# Patient Record
Sex: Female | Born: 1965
Health system: Southern US, Community
[De-identification: ages and names within clinical notes are randomized; demographics above are authoritative.]

## PROBLEM LIST (undated history)

## (undated) DIAGNOSIS — Z1371 Encounter for nonprocreative screening for genetic disease carrier status: Secondary | ICD-10-CM

## (undated) DIAGNOSIS — T7840XA Allergy, unspecified, initial encounter: Secondary | ICD-10-CM

## (undated) DIAGNOSIS — D249 Benign neoplasm of unspecified breast: Secondary | ICD-10-CM

## (undated) HISTORY — DX: Benign neoplasm of unspecified breast: D24.9

## (undated) HISTORY — DX: Allergy, unspecified, initial encounter: T78.40XA

## (undated) HISTORY — DX: Encounter for nonprocreative screening for genetic disease carrier status: Z13.71

---

## 1998-03-02 ENCOUNTER — Other Ambulatory Visit: Admission: RE | Admit: 1998-03-02 | Discharge: 1998-03-02 | Payer: Self-pay | Admitting: Gynecology

## 1999-03-03 ENCOUNTER — Other Ambulatory Visit: Admission: RE | Admit: 1999-03-03 | Discharge: 1999-03-03 | Payer: Self-pay | Admitting: Gynecology

## 2000-06-22 ENCOUNTER — Other Ambulatory Visit: Admission: RE | Admit: 2000-06-22 | Discharge: 2000-06-22 | Payer: Self-pay | Admitting: *Deleted

## 2000-07-04 HISTORY — PX: LIPOMA EXCISION: SHX5283

## 2001-01-14 ENCOUNTER — Inpatient Hospital Stay (HOSPITAL_COMMUNITY): Admission: AD | Admit: 2001-01-14 | Discharge: 2001-01-16 | Payer: Self-pay | Admitting: Gynecology

## 2001-01-18 ENCOUNTER — Encounter: Admission: RE | Admit: 2001-01-18 | Discharge: 2001-02-17 | Payer: Self-pay | Admitting: Gynecology

## 2001-02-26 ENCOUNTER — Other Ambulatory Visit: Admission: RE | Admit: 2001-02-26 | Discharge: 2001-02-26 | Payer: Self-pay | Admitting: Gynecology

## 2001-05-04 ENCOUNTER — Ambulatory Visit (HOSPITAL_COMMUNITY): Admission: RE | Admit: 2001-05-04 | Discharge: 2001-05-04 | Payer: Self-pay | Admitting: *Deleted

## 2001-05-04 ENCOUNTER — Encounter (INDEPENDENT_AMBULATORY_CARE_PROVIDER_SITE_OTHER): Payer: Self-pay | Admitting: Specialist

## 2002-09-04 ENCOUNTER — Other Ambulatory Visit: Admission: RE | Admit: 2002-09-04 | Discharge: 2002-09-04 | Payer: Self-pay | Admitting: Gynecology

## 2003-11-14 ENCOUNTER — Other Ambulatory Visit: Admission: RE | Admit: 2003-11-14 | Discharge: 2003-11-14 | Payer: Self-pay | Admitting: Gynecology

## 2004-12-07 ENCOUNTER — Encounter: Admission: RE | Admit: 2004-12-07 | Discharge: 2004-12-07 | Payer: Self-pay | Admitting: Gynecology

## 2005-01-07 ENCOUNTER — Other Ambulatory Visit: Admission: RE | Admit: 2005-01-07 | Discharge: 2005-01-07 | Payer: Self-pay | Admitting: Gynecology

## 2006-01-11 ENCOUNTER — Other Ambulatory Visit: Admission: RE | Admit: 2006-01-11 | Discharge: 2006-01-11 | Payer: Self-pay | Admitting: Gynecology

## 2006-03-07 ENCOUNTER — Ambulatory Visit: Payer: Self-pay | Admitting: Internal Medicine

## 2006-03-31 ENCOUNTER — Ambulatory Visit (HOSPITAL_BASED_OUTPATIENT_CLINIC_OR_DEPARTMENT_OTHER): Admission: RE | Admit: 2006-03-31 | Discharge: 2006-03-31 | Payer: Self-pay | Admitting: Gynecology

## 2006-03-31 HISTORY — PX: ENDOMETRIAL ABLATION: SHX621

## 2007-01-22 ENCOUNTER — Other Ambulatory Visit: Admission: RE | Admit: 2007-01-22 | Discharge: 2007-01-22 | Payer: Self-pay | Admitting: Gynecology

## 2007-03-17 ENCOUNTER — Encounter: Payer: Self-pay | Admitting: Internal Medicine

## 2007-09-26 ENCOUNTER — Ambulatory Visit: Payer: Self-pay | Admitting: Internal Medicine

## 2007-09-26 DIAGNOSIS — R030 Elevated blood-pressure reading, without diagnosis of hypertension: Secondary | ICD-10-CM | POA: Insufficient documentation

## 2007-09-26 DIAGNOSIS — J069 Acute upper respiratory infection, unspecified: Secondary | ICD-10-CM | POA: Insufficient documentation

## 2007-09-26 DIAGNOSIS — J309 Allergic rhinitis, unspecified: Secondary | ICD-10-CM

## 2007-10-08 ENCOUNTER — Encounter: Payer: Self-pay | Admitting: Internal Medicine

## 2007-12-28 ENCOUNTER — Encounter: Payer: Self-pay | Admitting: Internal Medicine

## 2008-01-29 ENCOUNTER — Other Ambulatory Visit: Admission: RE | Admit: 2008-01-29 | Discharge: 2008-01-29 | Payer: Self-pay | Admitting: Surgical Oncology

## 2008-03-11 ENCOUNTER — Encounter: Payer: Self-pay | Admitting: Internal Medicine

## 2008-03-12 ENCOUNTER — Ambulatory Visit: Payer: Self-pay | Admitting: Hematology

## 2008-04-03 ENCOUNTER — Ambulatory Visit: Payer: Self-pay | Admitting: Gynecology

## 2008-05-20 ENCOUNTER — Encounter: Payer: Self-pay | Admitting: Internal Medicine

## 2009-02-03 ENCOUNTER — Ambulatory Visit: Payer: Self-pay | Admitting: Gynecology

## 2009-02-03 ENCOUNTER — Other Ambulatory Visit: Admission: RE | Admit: 2009-02-03 | Discharge: 2009-02-03 | Payer: Self-pay | Admitting: Gynecology

## 2009-02-03 ENCOUNTER — Encounter: Payer: Self-pay | Admitting: Gynecology

## 2009-08-03 ENCOUNTER — Other Ambulatory Visit: Admission: RE | Admit: 2009-08-03 | Discharge: 2009-08-03 | Payer: Self-pay | Admitting: Radiology

## 2009-08-03 ENCOUNTER — Encounter: Payer: Self-pay | Admitting: Internal Medicine

## 2009-09-01 HISTORY — PX: BREAST SURGERY: SHX581

## 2009-09-02 ENCOUNTER — Ambulatory Visit (HOSPITAL_BASED_OUTPATIENT_CLINIC_OR_DEPARTMENT_OTHER): Admission: RE | Admit: 2009-09-02 | Discharge: 2009-09-02 | Payer: Self-pay | Admitting: General Surgery

## 2009-09-16 ENCOUNTER — Emergency Department (HOSPITAL_COMMUNITY): Admission: EM | Admit: 2009-09-16 | Discharge: 2009-09-17 | Payer: Self-pay | Admitting: Emergency Medicine

## 2010-01-18 ENCOUNTER — Ambulatory Visit: Payer: Self-pay | Admitting: Gynecology

## 2010-02-09 ENCOUNTER — Ambulatory Visit: Payer: Self-pay | Admitting: Gynecology

## 2010-02-09 ENCOUNTER — Other Ambulatory Visit: Admission: RE | Admit: 2010-02-09 | Discharge: 2010-02-09 | Payer: Self-pay | Admitting: Gynecology

## 2010-08-30 ENCOUNTER — Encounter: Payer: Self-pay | Admitting: Internal Medicine

## 2010-09-03 ENCOUNTER — Encounter: Payer: Self-pay | Admitting: Internal Medicine

## 2010-09-09 NOTE — Miscellaneous (Signed)
  Clinical Lists Changes  Observations: Added new observation of MAMMOGRAM: normal (08/30/2010 15:28)      Preventive Care Screening  Mammogram:    Date:  08/30/2010    Results:  normal

## 2010-09-27 LAB — DIFFERENTIAL
Basophils Relative: 1 % (ref 0–1)
Eosinophils Relative: 4 % (ref 0–5)
Lymphocytes Relative: 31 % (ref 12–46)
Monocytes Absolute: 0.7 10*3/uL (ref 0.1–1.0)
Monocytes Relative: 10 % (ref 3–12)
Neutro Abs: 3.6 10*3/uL (ref 1.7–7.7)

## 2010-09-27 LAB — CBC
Hemoglobin: 12.1 g/dL (ref 12.0–15.0)
Platelets: 291 10*3/uL (ref 150–400)

## 2010-09-27 LAB — BASIC METABOLIC PANEL
BUN: 10 mg/dL (ref 6–23)
CO2: 27 mEq/L (ref 19–32)
Calcium: 8.9 mg/dL (ref 8.4–10.5)
Chloride: 106 mEq/L (ref 96–112)
Creatinine, Ser: 0.61 mg/dL (ref 0.4–1.2)
GFR calc Af Amer: >60 mL/min (ref >60)
Glucose, Bld: 99 mg/dL (ref 70–99)

## 2010-11-19 NOTE — Op Note (Signed)
[facility]  Patient:    ZALEY, TALLEY Visit Number: [id number] MRN: [id number]          Service Type: DSU Location: DAY Attending Physician:  Vikki Ports. Dictated by:   Catalina Lunger, M.D. Proc. Date: 05/04/01 Admit Date:  05/04/2001 Discharge Date: 05/04/2001                             Operative Report  PREOPERATIVE DIAGNOSIS:  Bilateral axillary masses.  POSTOPERATIVE DIAGNOSIS:  Bilateral axillary masses.  PROCEDURE:  Excision of bilateral axillary masses.  ANESTHESIA:  General.  DESCRIPTION OF PROCEDURE:  The patient was taken to the operating room and placed in a supine position after adequate general anesthesia was induced. Bilateral axillas were prepped and draped in a normal sterile fashion.  Using an elliptical incision encompassing a significant amount of skin in the right axilla secondary to its redundance and dissected down through subcutaneous tissue, excising the fibrous mass in its entirity.  The defect was then closed with a running 3-0 nylon suture.  An identical procedure was then performed on the left axilla.  Again, this was closed with a running 3-0 nylon suture. Sterile dressings were applied.  The patient tolerated the procedure well and went to PACU in good condition. Dictated by:   Catalina Lunger, M.D. Attending Physician:  Vikki Ports DD:  05/29/01 TD:  05/29/01 Job: [id number]

## 2010-11-19 NOTE — Discharge Summary (Signed)
[facility]  Patient:    Isabel Skinner, Isabel Skinner                         MRN: [id number] Adm. Date:  [id number] Disc. Date: [id number] Attending:  Tonye Royalty Dictator:   Antony Contras, [facility]                           Discharge Summary  DISCHARGE DIAGNOSES:          1. Intrauterine pregnancy at term.                               2. Spontaneous onset of labor.  PROCEDURES:                   Normal spontaneous vaginal delivery of                               a viable infant over midline episiotomy, repair                               of periurethral tear.  HISTORY OF PRESENT ILLNESS:   The patient is a 45 year old, gravida 3, para 1, Ab 1. Prenatal course was benign with the exception that she had advanced maternal age, had a genetic amniocentesis with a normal chromosome. Also, she was being followed by Dr. 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 She will also follow her postpartum, if needed. Total Hollister was not available. The patients GBS status was negative.  HOSPITAL COURSE AND TREATMENT:                The patient was admitted on January 14, 2001 with spontaneous onset of labor. She was initially augmented with Pitocin and then had spontaneous rupture of membranes, and went on to become completely dilated and was delivered of an Apgar 8/9 female infant weighing 6 pounds 8 ounces over midline episiotomy with repair of periurethral tear which did require the placement of a Foley catheter for 24 hours. She also sustained another smaller perilabial tear which was not bleeding and was left intact.  POSTPARTUM COURSE:            She remained afebrile, had no difficulty voiding once the catheter was removed.  CBC: Hematocrit 32.7, hemoglobin 11.3, WBCs 14.4, platelets 217,000.  She was able to be discharged on her second  postpartum day in satisfactory condition.  DISPOSITION:                  The patient is to follow up in six weeks.  DISCHARGE MEDICATIONS:        The patient is to continue prenatal vitamins and iron, and Motrin and Tylox for pain. DD:  02/08/01 TD:  02/04/01 Job: [id number]

## 2010-11-19 NOTE — Assessment & Plan Note (Signed)
[facility]                             PRIMARY CARE OFFICE NOTE   HANIYYAH, SAKUMA                         MRN:          [id number]  DATE:03/07/2006                            DOB:          [birth year]    The patient is a 45 year old female who presents to establish primary with  me.  She needs to address problems with her elevated blood pressure and  allergies.   PAST MEDICAL HISTORY:  1. Dental implants.  2. Elevated cholesterol.   ALLERGIES:  No known drug allergies.   CURRENT MEDICATIONS:  1. Astelin.  2. Nasonex.  3. Allegra D p.r.n.   FAMILY HISTORY:  Father with hypertension.  Mother died of breast cancer at  the age of 42.  One sister was diagnosed with breast cancer at the age of  65.   SOCIAL HISTORY:  He is married with 2 children.  She is a Clinical biochemist.  Does not smoke.  Rare alcohol.  Has been exercising 3 times a week.   REVIEW OF SYSTEMS:  No chest pain or shortness of breath.  Blood pressure  was 140/92 when she presented for dental implants.  Regular GYN care with  Dr. Lily Peer.  She breast fed for 10 months.  Her youngest is 45 years old.  Normal periods.  The rest is negative.   PHYSICAL EXAMINATION:  VITAL SIGNS:  Blood pressure 112/77, pulse 88,  temperature 98.8, weight 142 pounds.  She is in no acute distress.  Looks  well.  HEENT:  Moist mucosa.  NECK:  Supple.  No thyromegaly or bruit.  LUNGS:  Clear.  No wheezes or rales.  HEART:  S1 and S2.  No murmur, no gallop.  ABDOMEN:  Soft, nontender.  No organomegaly is felt.  EXTREMITIES:  Lower extremities without edema.  NEUROLOGIC:  She is alert, oriented and cooperative.  Denies being  depression.   LABORATORY DATA:  None available.   ASSESSMENT AND PLAN:  1. Elevated blood pressure with normal numbers today.  She will purchase a      blood pressure cuff and record blood pressure twice a week for 3      months.  Low-salt diet.  I will see her back in 3  months to review.      Obtain lab work.  2. Family history of breast cancer.  She will look up information      regarding tamoxifen.  She was not interested in genetic testing in the      past.  She started with a mammogram and      regular breasts exams with Dr. Lily Peer.  3. Allergies.  She will try Allegra 180 mg daily through the season.                                   Georgina Quint. Plotnikov, MD   AVP/MedQ  DD:  03/09/2006  DT:  03/09/2006  Job #:  [id number]

## 2010-11-19 NOTE — H&P (Signed)
[facility]  Patient:    Isabel Skinner, DONE                           MRN: [id number] Adm. Date:  01/14/01 Attending:  Gaetano Hawthorne. Lily Peer, M.D.                         History and Physical  The patient is a 45 year old, gravida 3, para 1, AB 1, currently 39-4/[redacted] weeks gestation.  She was admitted to [facility] at approximately 0220 hours complaining of contractions.  She was found to be contracting every four to five minutes apart with a reassuring fetal heart rate tracing.  Her cervix on admission was 4 cm, 50% effaced, and vertex -1 station.  Her membranes were intact.  Her vital signs were as follows:  Blood pressure 118/80, pulse 90, respirations 18, temperature 97.1 degrees.  Her group B streptococcus status was negative.  She had a benign prenatal course with the exception that she had advanced maternal age and had a genetic amniocentesis with a normal chromosome fetus XX.  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  Will follow postpartum at her six-week visit if still present or causing her any problem.  The patient continued to labor spontaneously, but her contractions became more infrequent.  She was started on Pitocin augmentation at approximately 0530 hours in the morning.  At approximately 0826 hours this morning, she had spontaneous rupture of membranes and went on to be completely dilated at 0805 hours and had a normal spontaneous vaginal delivery at 0840 hours of a viable female infant with Apgars of 8 and 9.  The placenta was delivered intact with a three-vessel cord at 0840 hours.  The blood loss from the delivery was approximately 200-300 cc. The midline episiotomy was repaired with 2-0 and 3-0 Vicryl.  There was no extension, but periurethral there was  a tear that was bleeding which required placement of a Foley catheter and running superficial suture of 4-0 plain catgut suture in the area.  Will leave the Foley catheter in for 24 hours. There was another smaller perilabial tear which was not bleeding and was left intact.  The patient tolerated the procedure well without any complications. See the hollister form for additional information. DD:  01/14/01 TD:  01/14/01 Job: [id number]

## 2010-11-19 NOTE — H&P (Signed)
Isabel Skinner, Isabel Skinner                  ACCOUNT NO.:  [id number]   MEDICAL RECORD NO.:  [id number]          PATIENT TYPE:  AMB   LOCATION:  [facility]                         FACILITY:  [facility]   PHYSICIAN:  Juan H. Lily Peer, M.D.DATE OF BIRTH:  16-Feb-1966   DATE OF ADMISSION:  DATE OF DISCHARGE:                                HISTORY & PHYSICAL   CHIEF COMPLAINT:  Menorrhagia.   HISTORY:  The patient is a 45 year old gravida 3, para 2, AB 1, whose  husband has had a previous vasectomy.  She has been complaining for quite  some time of heavy periods; they are lasting 7-10 days with passage of large  clots.  The patient had an endometrial biopsy in the office on January 23, 2006, with normal endometrium with no evidence of hyperplasia or malignancy.  Her last Pap smear on July 11th was normal as well.  The patient had been  offered alternative treatment modalities and she decided to proceed with  outpatient endometrial ablation such as with NovaSure technique, for which  literature information was provided.   PAST MEDICAL HISTORY:  She denies any allergies.  Her husband has had a  vasectomy.  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  She is on multivitamins.  Her children were delivered  vaginally.  She had one miscarriage.   FAMILY HISTORY:  Father with history of diabetes, had been on insulin, as  well as hypertension.  Mother and sister with breast cancer.  The patient  had been offered BOCA-1 and BOCA-2 testing, but declined.  Recent MRI and  BSGI were normal on mammogram evaluation.   CURRENT MEDICATIONS:  1. Astelin.  2. Nasonex.  3. Allegra-D p.r.n.   PHYSICAL EXAMINATION:  VITAL SIGNS:  Patient is 5 feet, 4 inches tall,  weight 146 pounds.  Blood pressure 128/80.  HEENT:  Unremarkable.  NECK:  Supple.  Trachea midline.  No carotid bruits, no thyromegaly.  LUNGS:  Clear to auscultation without rhonchi or wheezes.  HEART:  Regular rate and  rhythm without murmurs, rubs or gallops.  BREAST EXAM:  At the time of her annual exam in July of this year, was  normal.  ABDOMEN:  Soft, nontender, without rebound or guarding.  PELVIC:  Bartholin, urethral and Skene's gland within normal limits.  Vagina  and cervix with no lesion or discharge.  Uterus anteverted, normal size,  shape and consistent.  Adnexa without masses or tenderness.  RECTAL:  Exam deferred.   ASSESSMENT:  A 45 year old gravida 3, para 2, AB 1, whose husband has had a  vasectomy.  Has been complaining of worsening menorrhagia, lasting 7-10 days  with passage of large clots.  Recent CBC in the office on July 11th showed  hemoglobin 11.5, hematocrit 36.3, platelet count 335,000.  The patient has  been instructed to take iron supplementation.  She had been provided with  literature information on treatment modalities for menorrhagia and decided  to proceed with outpatient endometrial ablation, such as with NovaSure  technique, for which literature information had been provided.  It's risks,  benefits, pros and cons, potential complications to include infection,  bleeding, perforation, burn injury to nearby structures, were discussed with  the patient, as well as emergency exploratory laparotomy.  The patient is  fully aware of the above risks and accepts.  She has had borderline  hypertension and had been referred to Dr. Georgina Quint. Plotnikov for medical  clearance and he has cleared her for surgery, monitoring her blood pressure  and dietary modification.  As she is currently on no blood pressure  medication at the present time, she could be suffering from white coat  syndrome.  All questions were answered.   PLAN:  Patient is scheduled for outpatient endometrial ablation at [facility] on Friday, September 28th, at 7:30 a.m.      Juan H. Lily Peer, M.D.  Electronically Signed     JHF/MEDQ  D:  03/29/2006  T:  03/30/2006  Job:  [id number]

## 2010-11-19 NOTE — Op Note (Signed)
[facility]  Patient:    FLORENCE, YEUNG Visit Number: [id number] MRN: [id number]          Service Type: DSU Location: DAY Attending Physician:  Vikki Ports. Dictated by:   Vikki Ports, M.D. Proc. Date: 05/04/01 Admit Date:  05/04/2001 Discharge Date: 05/04/2001                             Operative Report  PREOPERATIVE DIAGNOSIS:   Bilateral axillary masses.  POSTOPERATIVE DIAGNOSIS:  Bilateral axillary masses.  OPERATION:  Excision of bilateral axillary masses.  SURGEON:  Vikki Ports, M.D.  ANESTHESIA:  General  DESCRIPTION OF PROCEDURE:  The patient was taken to the operating room and placed in the supine position.  After adequate general anesthesia was induced using laryngeal mass, bilateral axillae were prepped and draped in the normal sterile fashion.  On the right side, the mass had displaced a significant amount of axillary skin and therefore an elliptical incision was made over the mass encompassing about 2 x 6 cm of skin.  Dissected down on to what was a poorly encapsulated fibrous fatty mass, possibly consistent with ectopic breast tissue.  This was all excised completely.  The skin defect was closed with a running 3-0 Nylon suture.  Identical procedure was then performed on the left also excising more skin and closing it with a running 3-0 Nylon suture.  Sterile dressings were applied.  The patient tolerated the procedure well and went to PACU in good condition. Dictated by:   Vikki Ports, M.D. Attending Physician:  Danna Hefty R. DD:  05/22/01 TD:  05/23/01 Job: [id number]

## 2010-11-19 NOTE — Op Note (Signed)
NAMEJAIYA, Isabel Skinner                  ACCOUNT NO.:  [id number]   MEDICAL RECORD NO.:  [id number]          PATIENT TYPE:  AMB   LOCATION:  [facility]                         FACILITY:  [facility]   PHYSICIAN:  Juan H. Lily Peer, M.D.DATE OF BIRTH:  1965-09-23   DATE OF PROCEDURE:  03/31/2006  DATE OF DISCHARGE:                                 OPERATIVE REPORT   INDICATIONS FOR OPERATION:  A 45 year old gravida 3, para 2, AB 1 with  menorrhagia.  Workup has consisted of an endometrial biopsy which had been  benign.  Patient's husband has had a vasectomy.  Patient requesting  noninvasive treatment as outpatient procedure and a NovaSure endometrial  ablation had been offered.   PREOPERATIVE DIAGNOSIS:  Menometrorrhagia.   POSTOPERATIVE DIAGNOSIS:  Menometrorrhagia.   PROCEDURE:  1. Diagnostic hysteroscopy.  2. Endometrial ablation, NovaSure technique.   FINDINGS:  Lush endometrium, both tubal ostia were identified.  Intrauterine  cavity with no abnormalities noted and smooth endocervical canal.   DESCRIPTION OF OPERATION:  After the patient is adequately counseled, she  was taken to the operating room, where she underwent intrauterine sedation.  The patient received 1 gm of Cefoxitin IV.  She was placed in the low  lithotomy position.  Due to the fact that she is allergic to [place], the  external genitalia, vagina, and cervix were cleansed with Hibiclens  solution.  A red rubber Roxan Hockey was inserted to evacuate the bladder of its  contents for approximately 25 cc.  The examination demonstrated the uterus  was anteverted, normal in size, shape, and consistency with no adnexal  masses.  A Graves speculum was introduced into the vaginal area.  Xylocaine  2% was infiltrated into the cervical vaginal stroma at the 2, 4, 8 and 10  o'clock position for approximately 10 cc.  A 30 degree hysteroscope was  introduced into the cervical canal and intrauterine cavity, and a systematic  evaluation of the  intrauterine cavity demonstrated both tubal ostia were  seen as well as the intrauterine cavity and smooth endocervical canal with  no gross pathology noted.  Once this was accomplished, an intrauterine  cavity was measured by first obtaining a fundal length measurement from the  fundus to the external cervical os, followed by measurement of the  endocervical canal measurement and subtracting the difference to assess the  size of the endometrial cavity.  The NovaSure endometrial ablation  instrument was set properly with the appropriate measurement, taking into  account the endometrial cavity measurement was inserted into the  intrauterine cavity, seated, and the width was measured at 4.6 mm.  Once the  seal was completed with the use of the radiofrequency with the endometrial  ablation with the NovaSure device, approximately two minutes was the time  for the ablation, the instrument was then removed.  The intrauterine cavity  was inspected hysteroscopically, and pre and post ablation  pictures were obtained.  A copy will be kept in the patient's outpatient  record, and one will be kept in the office at [facility].  Patient was transferred to recovery room with stable  vital signs.  Blood  loss was minimal.  Fluid resuscitation consisted of 1600 cc of lactated  Ringer's.      Juan H. Lily Peer, M.D.  Electronically Signed     JHF/MEDQ  D:  03/31/2006  T:  04/01/2006  Job:  [id number]

## 2011-04-18 ENCOUNTER — Encounter: Payer: Self-pay | Admitting: Anesthesiology

## 2011-04-21 ENCOUNTER — Encounter: Payer: Self-pay | Admitting: Gynecology

## 2011-04-25 ENCOUNTER — Ambulatory Visit (INDEPENDENT_AMBULATORY_CARE_PROVIDER_SITE_OTHER): Payer: BC Managed Care – PPO | Admitting: Gynecology

## 2011-04-25 ENCOUNTER — Encounter: Payer: Self-pay | Admitting: Gynecology

## 2011-04-25 ENCOUNTER — Other Ambulatory Visit (HOSPITAL_COMMUNITY)
Admission: RE | Admit: 2011-04-25 | Discharge: 2011-04-25 | Disposition: A | Discharge status: Home / Self Care | Payer: BC Managed Care – PPO | Source: Ambulatory Visit | Attending: Gynecology | Admitting: Gynecology

## 2011-04-25 ENCOUNTER — Encounter: Payer: Self-pay | Admitting: *Deleted

## 2011-04-25 VITALS — BP 128/82 | Ht 64.0 in | Wt 146.0 lb

## 2011-04-25 DIAGNOSIS — Z833 Family history of diabetes mellitus: Secondary | ICD-10-CM

## 2011-04-25 DIAGNOSIS — R823 Hemoglobinuria: Secondary | ICD-10-CM

## 2011-04-25 DIAGNOSIS — Z01419 Encounter for gynecological examination (general) (routine) without abnormal findings: Secondary | ICD-10-CM | POA: Insufficient documentation

## 2011-04-25 DIAGNOSIS — N644 Mastodynia: Secondary | ICD-10-CM

## 2011-04-25 DIAGNOSIS — N63 Unspecified lump in unspecified breast: Secondary | ICD-10-CM

## 2011-04-25 DIAGNOSIS — R635 Abnormal weight gain: Secondary | ICD-10-CM

## 2011-04-25 NOTE — Progress Notes (Signed)
BIRD TAILOR [birth year] [id number]   History:    45 y.o.  for annual exam for annual gynecological examination was concerned because of the tenderness in the right breast and thickening over prior biopsy site. Patient with history of a right lumpectomy and 2011 of the right breast in the same area she is concerned with any was benign. Mother and sister both had breast cancer. She has been tested and does not have the BRCA one BRCA2 gene. She did have a mammogram in February of this year and they had recommended a diagnostic mammogram in one year with ultrasound of the right breast. Patient with dense breasts bilaterally and a small retroareolar simple cyst of the left breast was reported with a prior ultrasound. Patient states her cycles are regular she's had an ablation in the past. Husband had a vasectomy. She does her monthly self breast examination. Review of her record indicates she was weighing 140-146 with a BMI of 25.06. Patient's father was insulin-dependent diabetic.  Past medical history,surgical history, family history and social history were all reviewed and documented in the EPIC chart.  ROS:  Was performed and pertinent positives and negatives are included in the history.  Exam: chaperone present BP 128/82  Ht 5\' 4"  (1.626 m)  Wt 146 lb (66.225 kg)  BMI 25.06 kg/m2  LMP 04/22/2011  Body mass index is 25.06 kg/(m^2).  General appearance : Well developed well nourished female. No acute distress HEENT: Neck supple, trachea midline, no carotid bruits, no thyroidmegaly Lungs: Clear to auscultation, no rhonchi or wheezes, or rib retractions  Heart: Regular rate and rhythm, no murmurs or gallops Breast:Examined in sitting and supine position. Left breast no palpable masses or tenderness or supraclavicular or axillary lymphadenopathy. Right breast periareolar region between the 9 and 12:00 position indurated tender area and raised. Over the previous biopsy site. No supraclavicular or  axillary lymphadenopathy.  Abdomen: no palpable masses or tenderness, no rebound or guarding Extremities: no edema or skin discoloration or tenderness  Pelvic:  Bartholin, Urethra, Skene Glands: Within normal limits             Vagina: No gross lesions or discharge  Cervix: No gross lesions or discharge  Uterus  anteverted, normal size, shape and consistency, non-tender and mobile  Adnexa  Without masses or tenderness  Anus and perineum  normal   Rectovaginal  normal sphincter tone without palpated masses or tenderness             Hemoccult not done     Assessment/Plan:  45 y.o. female for annual exam with a right breast mass which patient states is gone larger and more tender than when she had the biopsy on the same location back in 2011. She will be sent to the radiology department for a diagnostic mammogram and ultrasound instead of waiting for 3 more months when they had recommended for followup due to patient's symptomatology. Due to her strong family history diabetes in her not being in a fasting state we'll do a hemoglobin A1c along with CBC screening cholesterol urinalysis and Pap smear. We'll wait for results and manage accordingly. She was encouraged to continue to take her calcium and vitamin D for osteoporosis prevention and engage in weightbearing exercises 3-4 times a week for at least 45 minutes. Patient declined flu vaccine.    Ok Edwards MD, 3:35 PM 04/25/2011

## 2011-04-25 NOTE — Progress Notes (Signed)
Appointment at [facility] for rt  diag. Mammogram and possible rt breast ultrasound on 04/27/11 @ 1:45pm. Order faxed and left message on pt voicemail with the above time and date.

## 2011-04-27 ENCOUNTER — Other Ambulatory Visit: Payer: Self-pay | Admitting: *Deleted

## 2011-04-27 DIAGNOSIS — R7309 Other abnormal glucose: Secondary | ICD-10-CM

## 2011-04-28 ENCOUNTER — Other Ambulatory Visit (INDEPENDENT_AMBULATORY_CARE_PROVIDER_SITE_OTHER): Payer: BC Managed Care – PPO | Admitting: *Deleted

## 2011-04-28 ENCOUNTER — Encounter: Payer: Self-pay | Admitting: Gynecology

## 2011-04-28 ENCOUNTER — Other Ambulatory Visit: Payer: Self-pay | Admitting: Gynecology

## 2011-04-28 DIAGNOSIS — R7309 Other abnormal glucose: Secondary | ICD-10-CM

## 2011-04-28 DIAGNOSIS — N644 Mastodynia: Secondary | ICD-10-CM

## 2011-04-28 DIAGNOSIS — R7989 Other specified abnormal findings of blood chemistry: Secondary | ICD-10-CM

## 2011-04-28 DIAGNOSIS — N63 Unspecified lump in unspecified breast: Secondary | ICD-10-CM

## 2011-08-26 ENCOUNTER — Other Ambulatory Visit: Payer: Self-pay | Admitting: *Deleted

## 2011-08-26 DIAGNOSIS — N6019 Diffuse cystic mastopathy of unspecified breast: Secondary | ICD-10-CM

## 2011-09-04 ENCOUNTER — Ambulatory Visit (INDEPENDENT_AMBULATORY_CARE_PROVIDER_SITE_OTHER): Payer: BC Managed Care – PPO | Admitting: Family Medicine

## 2011-09-04 ENCOUNTER — Ambulatory Visit: Payer: BC Managed Care – PPO

## 2011-09-04 VITALS — BP 115/80 | HR 77 | Temp 97.7°F | Resp 16 | Ht 64.25 in | Wt 145.4 lb

## 2011-09-04 DIAGNOSIS — R05 Cough: Secondary | ICD-10-CM

## 2011-09-04 DIAGNOSIS — J069 Acute upper respiratory infection, unspecified: Secondary | ICD-10-CM

## 2011-09-04 MED ORDER — HYDROCODONE-HOMATROPINE 5-1.5 MG/5ML PO SYRP: 5.0000 mL | ORAL_SOLUTION | Freq: Three times a day (TID) | ORAL | Status: AC | PRN

## 2011-09-04 MED ORDER — CEFDINIR 300 MG PO CAPS: 300.0000 mg | ORAL_CAPSULE | Freq: Two times a day (BID) | ORAL | Status: AC

## 2011-09-04 NOTE — Progress Notes (Signed)
  Patient Name: Isabel Skinner Date of Birth: 25-Dec-1965 Medical Record Number: [id number] Gender: female Date of Encounter: 09/04/2011  History of Present Illness:  Isabel Skinner is a 46 y.o. very pleasant female patient who presents with the following:  Here today with illness.  "Horrible head congestion."  Chest feels heavy (when she lies down only), had a sore throat but this has gotten better.  Has a painful cough, feels fatigued.  Nasal congestion and sinus pressure and pain.  No known fever,  Does have chills and body aches.   Symptoms started 3 days ago.  Cough is non- productive. Also has earache and headache.  No GI symptoms.  Generally healthy, only medications used chroinicaly are for allergies.   No recent travel, no hemoptysis.  Overall feels achy and like she might even have the flu  Patient Active Problem List  Diagnoses  . URI  . ALLERGIC RHINITIS  . ELEVATED BLOOD PRESSURE WITHOUT DIAGNOSIS OF HYPERTENSION   Past Medical History  Diagnosis Date  . Fibroadenoma     RIGHT AXILLA  . BRCA1 negative   . BRCA2 negative    Past Surgical History  Procedure Date  . Endometrial ablation 03/31/2006    NOVASURE  . Lipoma excision 2002    RIGHT AND LEFT UNDER ARMS  . Breast surgery 09/2009    RIGHT BREAST LUMP- LUMPECTOMY BENIGN.   History  Substance Use Topics  . Smoking status: Never Smoker   . Smokeless tobacco: Never Used  . Alcohol Use: Yes     SOCIAL   Family History  Problem Relation Age of Onset  . Breast cancer Mother   . Diabetes Father     INSULIN  . Hypertension Father   . Breast cancer Sister    No Known Allergies  Medication list has been reviewed and updated.  Review of Systems: As per HPI- otherwise negative.   Physical Examination: Filed Vitals:   09/04/11 1350  BP: 115/80  Pulse: 77  Temp: 97.7 F (36.5 C)  TempSrc: Oral  Resp: 16  Height: 5' 4.25" (1.632 m)  Weight: 145 lb 6.4 oz (65.953 kg)  O2 sat 100%.  LMP less than one month  ago  Body mass index is 24.76 kg/(m^2).  GEN: WDWN, NAD, Non-toxic, A & O x 3 HEENT: Atraumatic, Normocephalic. Neck supple. No masses, No LAD. TM wnl bilaterally, oropharynx wnl.  Nasal cavity congested, frontal sinuses tender Ears and Nose: No external deformity. CV: RRR, No M/G/R. No JVD. No thrill. No extra heart sounds. PULM: question decreased sounds right lower lobe, no wheezes, crackles, rhonchi. No retractions. No resp. distress. No accessory muscle use. EXTR: No c/c/e NEURO Normal gait.  PSYCH: Normally interactive. Conversant. Not depressed or anxious appearing.  Calm demeanor.   [facility] reading (PRIMARY) by  Dr. Patsy Lager.   Negative chest   Assessment and Plan: 1. Cough  DG Chest 2 View, cefdinir (OMNICEF) 300 MG capsule, HYDROcodone-homatropine (HYCODAN) 5-1.5 MG/5ML syrup  2. URI (upper respiratory infection)     Treat for bronchitis and sinusitis as above.  Patient (or parent if minor) instructed to return to clinic or call if not better in 2-3 day(s).  If she becomes SOB or has other symptoms- to ED.  She agreed with plan

## 2011-11-24 ENCOUNTER — Telehealth: Payer: Self-pay | Admitting: *Deleted

## 2011-11-24 NOTE — Telephone Encounter (Signed)
Left message on pt voicemail regarding [facility] additional images needed, left [facility] on pt voicemail to schedule.

## 2011-12-01 ENCOUNTER — Other Ambulatory Visit: Payer: Self-pay | Admitting: Gynecology

## 2011-12-01 ENCOUNTER — Other Ambulatory Visit: Payer: Self-pay | Admitting: *Deleted

## 2011-12-01 DIAGNOSIS — IMO0001 Reserved for inherently not codable concepts without codable children: Secondary | ICD-10-CM

## 2011-12-29 ENCOUNTER — Encounter: Payer: Self-pay | Admitting: Internal Medicine

## 2012-04-25 ENCOUNTER — Encounter: Payer: Self-pay | Admitting: Gynecology

## 2012-04-25 ENCOUNTER — Ambulatory Visit (INDEPENDENT_AMBULATORY_CARE_PROVIDER_SITE_OTHER): Payer: BC Managed Care – PPO | Admitting: Gynecology

## 2012-04-25 VITALS — BP 120/80 | Ht 64.0 in | Wt 146.0 lb

## 2012-04-25 DIAGNOSIS — Z23 Encounter for immunization: Secondary | ICD-10-CM

## 2012-04-25 DIAGNOSIS — N949 Unspecified condition associated with female genital organs and menstrual cycle: Secondary | ICD-10-CM

## 2012-04-25 DIAGNOSIS — Z803 Family history of malignant neoplasm of breast: Secondary | ICD-10-CM

## 2012-04-25 DIAGNOSIS — Z01419 Encounter for gynecological examination (general) (routine) without abnormal findings: Secondary | ICD-10-CM

## 2012-04-25 DIAGNOSIS — Z809 Family history of malignant neoplasm, unspecified: Secondary | ICD-10-CM | POA: Insufficient documentation

## 2012-04-25 DIAGNOSIS — N9489 Other specified conditions associated with female genital organs and menstrual cycle: Secondary | ICD-10-CM

## 2012-04-25 DIAGNOSIS — Z833 Family history of diabetes mellitus: Secondary | ICD-10-CM

## 2012-04-25 LAB — CBC WITH DIFFERENTIAL/PLATELET
Basophils Absolute: 0 10*3/uL (ref 0.0–0.1)
Eosinophils Relative: 5 % (ref 0–5)
HCT: 36.3 % (ref 36.0–46.0)
MCH: 28.4 pg (ref 26.0–34.0)
MCHC: 33.3 g/dL (ref 30.0–36.0)
MCV: 85.2 fL (ref 78.0–100.0)
Neutro Abs: 3.4 10*3/uL (ref 1.7–7.7)

## 2012-04-25 NOTE — Progress Notes (Signed)
Isabel Skinner 1966-06-09 [id number]   History:    46 y.o.  for annual gyn exam with no complaints today. Patient has a history of right lumpectomy and 2011 of the right breast with the following result:  FIBROCYSTIC CHANGES WITH SCLEROSING ADENOSIS AND CALCIFICATIONS. - THERE IS NO EVIDENCE OF MALIGNANCY  Mother and sister both had breast cancer. She has been tested and does not have the BRCA one BRCA2 gene.  Mammogram this year normal small cyst on left breast and benign-appearing microcalcification described by the radiologist with followup mammogram in one year. Patient's husband had a vasectomy. Patient does her monthly self breast examination. Patient's weight has remained stable since last year and 146. With a stable BMI 25. Patient having normal menstrual cycles. Patient's father was insulin-dependent diabetic.  Past medical history,surgical history, family history and social history were all reviewed and documented in the EPIC chart.  Gynecologic History Patient's last menstrual period was 04/08/2012. Contraception: vasectomy Last Pap: 2012. Results were: normal Last mammogram: 2013. Results were: Small left breast cyst and benign scattered calcification and dense breasts bilateral.  Obstetric History OB History    Grav Para Term Preterm Abortions TAB SAB Ect Mult Living   3 2   1  1   2      # Outc Date GA Lbr Len/2nd Wgt Sex Del Anes PTL Lv   1 PAR     M SVD   Yes   2 PAR     F SVD   Yes   3 SAB                ROS: A ROS was performed and pertinent positives and negatives are included in the history.  GENERAL: No fevers or chills. HEENT: No change in vision, no earache, sore throat or sinus congestion. NECK: No pain or stiffness. CARDIOVASCULAR: No chest pain or pressure. No palpitations. PULMONARY: No shortness of breath, cough or wheeze. GASTROINTESTINAL: No abdominal pain, nausea, vomiting or diarrhea, melena or bright red blood per rectum. GENITOURINARY: No urinary  frequency, urgency, hesitancy or dysuria. MUSCULOSKELETAL: No joint or muscle pain, no back pain, no recent trauma. DERMATOLOGIC: No rash, no itching, no lesions. ENDOCRINE: No polyuria, polydipsia, no heat or cold intolerance. No recent change in weight. HEMATOLOGICAL: No anemia or easy bruising or bleeding. NEUROLOGIC: No headache, seizures, numbness, tingling or weakness. PSYCHIATRIC: No depression, no loss of interest in normal activity or change in sleep pattern.     Exam: chaperone present  BP 120/80  Ht 5\' 4"  (1.626 m)  Wt 146 lb (66.225 kg)  BMI 25.06 kg/m2  LMP 04/08/2012  Body mass index is 25.06 kg/(m^2).  General appearance : Well developed well nourished female. No acute distress HEENT: Neck supple, trachea midline, no carotid bruits, no thyroidmegaly Lungs: Clear to auscultation, no rhonchi or wheezes, or rib retractions  Heart: Regular rate and rhythm, no murmurs or gallops Breast:Examined in sitting and supine position were symmetrical in appearance, no palpable masses or tenderness,  no skin retraction, no nipple inversion, no nipple discharge, no skin discoloration, no axillary or supraclavicular lymphadenopathy Abdomen: no palpable masses or tenderness, no rebound or guarding Extremities: no edema or skin discoloration or tenderness  Pelvic:  Bartholin, Urethra, Skene Glands: Within normal limits             Vagina: No gross lesions or discharge  Cervix: No gross lesions or discharge  Uterus  upper limits of normal, normal size, shape and consistency, non-tender and  mobile  Adnexa  right adnexal fullness Anus and perineum  normal   Rectovaginal  normal sphincter tone without palpated masses or tenderness             Hemoccult not done     Assessment/Plan:  46 y.o. female for annual exam with right adnexal fullness and tenderness noted during pelvic exam. Patient will return to the office next week for an ultrasound for better assessment. Patient did not recall ever  having received a Tdap vaccine. She will receive the Tdap vaccine today as well as her flu vaccine. Patient was counseled for both. She was encouraged to continue to do her monthly self breast examination. The following labs were ordered today: CBC, total cholesterol, urinalysis, TSH and hemoglobin A1c. I would recommend that next year her mammogram be 3-D since she has history of dense breasts. We discussed a new Pap smear screening guidelines and no Pap smear was done today. Patient with no history of prior abnormal Pap smears.   Ok Edwards MD, 5:11 PM 04/25/2012

## 2012-04-25 NOTE — Patient Instructions (Signed)

## 2012-04-26 LAB — URINALYSIS W MICROSCOPIC + REFLEX CULTURE
Bilirubin Urine: NEGATIVE
Casts: NONE SEEN
Crystals: NONE SEEN
Glucose, UA: NEGATIVE mg/dL
Ketones, ur: NEGATIVE mg/dL
Leukocytes, UA: NEGATIVE
Specific Gravity, Urine: 1.013 (ref 1.005–1.030)
pH: 6.5 (ref 5.0–8.0)

## 2012-04-26 LAB — TSH: TSH: 1.059 u[IU]/mL (ref 0.350–4.500)

## 2012-04-26 LAB — HEMOGLOBIN A1C: Mean Plasma Glucose: 117 mg/dL — ABNORMAL HIGH (ref <117)

## 2012-05-01 ENCOUNTER — Encounter: Payer: Self-pay | Admitting: Gynecology

## 2012-05-03 ENCOUNTER — Ambulatory Visit (INDEPENDENT_AMBULATORY_CARE_PROVIDER_SITE_OTHER): Payer: BC Managed Care – PPO

## 2012-05-03 ENCOUNTER — Ambulatory Visit (INDEPENDENT_AMBULATORY_CARE_PROVIDER_SITE_OTHER): Payer: BC Managed Care – PPO | Admitting: Gynecology

## 2012-05-03 ENCOUNTER — Encounter: Payer: Self-pay | Admitting: Gynecology

## 2012-05-03 DIAGNOSIS — N831 Corpus luteum cyst of ovary, unspecified side: Secondary | ICD-10-CM

## 2012-05-03 DIAGNOSIS — N9489 Other specified conditions associated with female genital organs and menstrual cycle: Secondary | ICD-10-CM

## 2012-05-03 DIAGNOSIS — N949 Unspecified condition associated with female genital organs and menstrual cycle: Secondary | ICD-10-CM

## 2012-05-03 DIAGNOSIS — D251 Intramural leiomyoma of uterus: Secondary | ICD-10-CM

## 2012-05-03 DIAGNOSIS — D259 Leiomyoma of uterus, unspecified: Secondary | ICD-10-CM

## 2012-05-03 DIAGNOSIS — N852 Hypertrophy of uterus: Secondary | ICD-10-CM

## 2012-05-03 DIAGNOSIS — N8300 Follicular cyst of ovary, unspecified side: Secondary | ICD-10-CM

## 2012-05-03 DIAGNOSIS — E7800 Pure hypercholesterolemia, unspecified: Secondary | ICD-10-CM | POA: Insufficient documentation

## 2012-05-03 NOTE — Patient Instructions (Signed)
Uterine Fibroid A uterine fibroid is a growth (tumor) that occurs in a woman's uterus. This type of tumor is not cancerous and does not spread out of the uterus. A woman can have one or many fibroids, and the fiboid(s) can become quite large. A fibroid can vary in size, weight, and where it grows in the uterus. Most fibroids do not require medical treatment, but some can cause pain or heavy bleeding during and between periods. CAUSES  A fibroid is the result of a single uterine cell that keeps growing (unregulated), which is different than most cells in the human body. Most cells have a control mechanism that keeps them from reproducing without control.  SYMPTOMS   Bleeding.  Pelvic pain and pressure.  Bladder problems due to the size of the fibroid.  Infertility and miscarriages depending on the size and location of the fibroid. DIAGNOSIS  A diagnosis is made by physical exam. Your caregiver may feel the lumpy tumors during a pelvic exam. Important information regarding size, location, and number of tumors can be gained by having an ultrasound. It is rare that other tests, such as a CT scan or MRI, are needed. TREATMENT   Your caregiver may recommend watchful waiting. This involves getting the fibroid checked by your caregiver to see if the fibroids grow or shrink.   Hormonal treatment or an intrauterine device (IUD) may be prescribed.   Surgery may be needed to remove the fibroids (myomectomy) or the uterus (hysterectomy). This depends on your situation. When fibroids interfere with fertility and a woman wants to become pregnant, a caregiver may recommend having the fibroids removed.  HOME CARE INSTRUCTIONS  Home care depends on how you were treated. In general:   Keep all follow-up appointments with your caregiver.   Only take medicine as told by your caregiver. Do not take aspirin. It can cause bleeding.   If you have excessive periods and soak tampons or pads in a half hour or  less, contact your caregiver immediately. If your periods are troublesome but not so heavy, lie down with your feet raised slightly above your heart. Place cold packs on your lower abdomen.   If your periods are heavy, write down the number of pads or tampons you use per month. Bring this information to your caregiver.   Talk to your caregiver about taking iron pills.   Include green vegetables in your diet.   If you were prescribed a hormonal treatment, take the hormonal medicines as directed.   If you need surgery, ask your caregiver for information on your specific surgery.  SEEK IMMEDIATE MEDICAL CARE IF:  You have pelvic pain or cramps not controlled with medicines.   You have a sudden increase in pelvic pain.   You have an increase of bleeding between and during periods.   You feel lightheaded or have fainting episodes.  MAKE SURE YOU:  Understand these instructions.  Will watch your condition.  Will get help right away if you are not doing well or get worse. Document Released: 06/17/2000 Document Revised: 09/12/2011 Document Reviewed: 07/11/2011 [facility] Patient Information 2013 [place], [place].                                                   Cholesterol Control Diet  Cholesterol levels in your body are determined significantly by your diet. Cholesterol  levels may also be related to heart disease. The following material helps to explain this relationship and discusses what you can do to help keep your heart healthy. Not all cholesterol is bad. Low-density lipoprotein (LDL) cholesterol is the "bad" cholesterol. It may cause fatty deposits to build up inside your arteries. High-density lipoprotein (HDL) cholesterol is "good." It helps to remove the "bad" LDL cholesterol from your blood. Cholesterol is a very important risk factor for heart disease. Other risk factors are high blood pressure, smoking, stress, heredity, and weight. The heart muscle gets its supply of  blood through the coronary arteries. If your LDL cholesterol is high and your HDL cholesterol is low, you are at risk for having fatty deposits build up in your coronary arteries. This leaves less room through which blood can flow. Without sufficient blood and oxygen, the heart muscle cannot function properly and you may feel chest pains (angina pectoris). When a coronary artery closes up entirely, a part of the heart muscle may die, causing a heart attack (myocardial infarction). CHECKING CHOLESTEROL When your caregiver sends your blood to a lab to be analyzed for cholesterol, a complete lipid (fat) profile may be done. With this test, the total amount of cholesterol and levels of LDL and HDL are determined. Triglycerides are a type of fat that circulates in the blood and can also be used to determine heart disease risk. The list below describes what the numbers should be: Test: Total Cholesterol.  Less than 200 mg/dl.  Test: LDL "bad cholesterol."  Less than 100 mg/dl.   Less than 70 mg/dl if you are at very high risk of a heart attack or sudden cardiac death.  Test: HDL "good cholesterol."  Greater than 50 mg/dl for women.   Greater than 40 mg/dl for men.  Test: Triglycerides.  Less than 150 mg/dl.  CONTROLLING CHOLESTEROL WITH DIET Although exercise and lifestyle factors are important, your diet is key. That is because certain foods are known to raise cholesterol and others to lower it. The goal is to balance foods for their effect on cholesterol and more importantly, to replace saturated and trans fat with other types of fat, such as monounsaturated fat, polyunsaturated fat, and omega-3 fatty acids. On average, a person should consume no more than 15 to 17 g of saturated fat daily. Saturated and trans fats are considered "bad" fats, and they will raise LDL cholesterol. Saturated fats are primarily found in animal products such as meats, butter, and cream. However, that does not mean you  need to sacrifice all your favorite foods. Today, there are good tasting, low-fat, low-cholesterol substitutes for most of the things you like to eat. Choose low-fat or nonfat alternatives. Choose round or loin cuts of red meat, since these types of cuts are lowest in fat and cholesterol. Chicken (without the skin), fish, veal, and ground [place] breast are excellent choices. Eliminate fatty meats, such as hot dogs and salami. Even shellfish have little or no saturated fat. Have a 3 oz (85 g) portion when you eat lean meat, poultry, or fish. Trans fats are also called "partially hydrogenated oils." They are oils that have been scientifically manipulated so that they are solid at room temperature resulting in a longer shelf life and improved taste and texture of foods in which they are added. Trans fats are found in stick margarine, some tub margarines, cookies, crackers, and baked goods.  When baking and cooking, oils are an excellent substitute for butter. The monounsaturated oils are  especially beneficial since it is believed they lower LDL and raise HDL. The oils you should avoid entirely are saturated tropical oils, such as coconut and palm.  Remember to eat liberally from food groups that are naturally free of saturated and trans fat, including fish, fruit, vegetables, beans, grains (barley, rice, couscous, bulgur wheat), and pasta (without cream sauces).  IDENTIFYING FOODS THAT LOWER CHOLESTEROL  Soluble fiber may lower your cholesterol. This type of fiber is found in fruits such as apples, vegetables such as broccoli, potatoes, and carrots, legumes such as beans, peas, and lentils, and grains such as barley. Foods fortified with plant sterols (phytosterol) may also lower cholesterol. You should eat at least 2 g per day of these foods for a cholesterol lowering effect.  Read package labels to identify low-saturated fats, trans fats free, and low-fat foods at the supermarket. Select cheeses that have only  2 to 3 g saturated fat per ounce. Use a heart-healthy tub margarine that is free of trans fats or partially hydrogenated oil. When buying baked goods (cookies, crackers), avoid partially hydrogenated oils. Breads and muffins should be made from whole grains (whole-wheat or whole oat flour, instead of "flour" or "enriched flour"). Buy non-creamy canned soups with reduced salt and no added fats.  FOOD PREPARATION TECHNIQUES  Never deep-fry. If you must fry, either stir-fry, which uses very little fat, or use non-stick cooking sprays. When possible, broil, bake, or roast meats, and steam vegetables. Instead of dressing vegetables with butter or margarine, use lemon and herbs, applesauce and cinnamon (for squash and sweet potatoes), nonfat yogurt, salsa, and low-fat dressings for salads.  LOW-SATURATED FAT / LOW-FAT FOOD SUBSTITUTES Meats / Saturated Fat (g)  Avoid: Steak, marbled (3 oz/85 g) / 11 g   Choose: Steak, lean (3 oz/85 g) / 4 g   Avoid: Hamburger (3 oz/85 g) / 7 g   Choose: Hamburger, lean (3 oz/85 g) / 5 g   Avoid: Ham (3 oz/85 g) / 6 g   Choose: Ham, lean cut (3 oz/85 g) / 2.4 g   Avoid: Chicken, with skin, dark meat (3 oz/85 g) / 4 g   Choose: Chicken, skin removed, dark meat (3 oz/85 g) / 2 g   Avoid: Chicken, with skin, light meat (3 oz/85 g) / 2.5 g   Choose: Chicken, skin removed, light meat (3 oz/85 g) / 1 g  Dairy / Saturated Fat (g)  Avoid: Whole milk (1 cup) / 5 g   Choose: Low-fat milk, 2% (1 cup) / 3 g   Choose: Low-fat milk, 1% (1 cup) / 1.5 g   Choose: Skim milk (1 cup) / 0.3 g   Avoid: Hard cheese (1 oz/28 g) / 6 g   Choose: Skim milk cheese (1 oz/28 g) / 2 to 3 g   Avoid: Cottage cheese, 4% fat (1 cup) / 6.5 g   Choose: Low-fat cottage cheese, 1% fat (1 cup) / 1.5 g   Avoid: Ice cream (1 cup) / 9 g   Choose: Sherbet (1 cup) / 2.5 g   Choose: Nonfat frozen yogurt (1 cup) / 0.3 g   Choose: Frozen fruit bar / trace   Avoid: Whipped cream (1 tbs)  / 3.5 g   Choose: Nondairy whipped topping (1 tbs) / 1 g  Condiments / Saturated Fat (g)  Avoid: Mayonnaise (1 tbs) / 2 g   Choose: Low-fat mayonnaise (1 tbs) / 1 g   Avoid: Butter (1 tbs) / 7 g  Choose: Extra light margarine (1 tbs) / 1 g   Avoid: Coconut oil (1 tbs) / 11.8 g   Choose: Olive oil (1 tbs) / 1.8 g   Choose: Corn oil (1 tbs) / 1.7 g   Choose: Safflower oil (1 tbs) / 1.2 g   Choose: Sunflower oil (1 tbs) / 1.4 g   Choose: Soybean oil (1 tbs) / 2.4 g   Choose: Canola oil (1 tbs) / 1 g  Document Released: 06/20/2005 Document Revised: 03/02/2011 Document Reviewed: 12/09/2010 [facility] Patient Information 2012 [place], [place].

## 2012-05-03 NOTE — Progress Notes (Signed)
46 year old patient was seen in the office on October 23 for her annual exam. During her pelvic exam she was found to have a right adnexal fullness and tender and was asked to return to the office today for an ultrasound for better assessment. She's been having normal menstrual period and otherwise been doing well. The ultrasound with the following result:  Uterus measured 10 x 6.8 x 6.2 cm with an endometrial stripe of 13.4 mm. 3 intramural fibroids were noted the largest one measuring 29 x 19 mm which slightly projected into the endometrial cavity. Cortical cystic area the myometrium was noted. A right corpus luteum cyst measuring 18 x 17 x 15 mm was seen. The left ovary demonstrated what appears to be a collapsed thick wall follicle measuring 22 x 15 mm with positive color flow the periphery.  Patient's recent labs which included CBC, TSH, urinalysis and screening cholesterol demonstrated only that her total cholesterol was elevated at 208. Patient will return back next week for fasting lipid profile.  Assessment/plan: Patient with 3 small intramural myomas asymptomatic. Small corpus luteum cyst noted on the right ovary. Patient was reassured. We'll continue to monitor for any symptoms or any irregular bleeding and she will report to the office immediately otherwise we will repeat her ultrasound in one year.

## 2012-05-07 ENCOUNTER — Other Ambulatory Visit: Payer: BC Managed Care – PPO

## 2012-05-07 DIAGNOSIS — E7800 Pure hypercholesterolemia, unspecified: Secondary | ICD-10-CM

## 2012-05-07 LAB — LIPID PANEL
HDL: 54 mg/dL (ref >39)
LDL Cholesterol: 107 mg/dL — ABNORMAL HIGH (ref 0–99)
Total CHOL/HDL Ratio: 3.3 Ratio

## 2012-08-03 ENCOUNTER — Telehealth: Payer: Self-pay | Admitting: *Deleted

## 2012-08-03 NOTE — Telephone Encounter (Signed)
Pt called c/o new breast problem, I called pt and left for pt to make OV with JF for new problem.

## 2012-08-09 ENCOUNTER — Ambulatory Visit (INDEPENDENT_AMBULATORY_CARE_PROVIDER_SITE_OTHER): Payer: BC Managed Care – PPO | Admitting: Gynecology

## 2012-08-09 ENCOUNTER — Encounter: Payer: Self-pay | Admitting: Gynecology

## 2012-08-09 VITALS — BP 122/80

## 2012-08-09 DIAGNOSIS — N63 Unspecified lump in unspecified breast: Secondary | ICD-10-CM

## 2012-08-09 DIAGNOSIS — N6310 Unspecified lump in the right breast, unspecified quadrant: Secondary | ICD-10-CM | POA: Insufficient documentation

## 2012-08-09 DIAGNOSIS — N6320 Unspecified lump in the left breast, unspecified quadrant: Secondary | ICD-10-CM

## 2012-08-10 ENCOUNTER — Telehealth: Payer: Self-pay | Admitting: *Deleted

## 2012-08-10 DIAGNOSIS — N6310 Unspecified lump in the right breast, unspecified quadrant: Secondary | ICD-10-CM

## 2012-08-10 DIAGNOSIS — N6320 Unspecified lump in the left breast, unspecified quadrant: Secondary | ICD-10-CM

## 2012-08-10 NOTE — Telephone Encounter (Signed)
Left message for pt to call. Appointment at [facility] on 08/14/12 @  1:00 pm and appt. With Dr. Dwain Sarna on Feb 18 @ 3:10 pm.

## 2012-08-10 NOTE — Telephone Encounter (Signed)
Pt informed with the below note. 

## 2012-08-10 NOTE — Progress Notes (Signed)
Patient is a 47 year old that presented to the office today stating that she noted a left breast mass in December 2013. She stated that was quite large and over the past few days he has gotten smaller. Patient was questioning whether she may have had a slight nipple discharge. She's having some tenderness. She had a mammogram in May of 2013 whereby the report stated that she had enlarged mass sub-areolar region of the left breast that on ultrasound it was a simple 3.7 cm cyst with no suspicious calcifications or distortion. Right breast subtle postsurgical change with no change from previous study. Patient had history of right lumpectomy in 2011 with the following pathology report:  1. BREAST, LUMPECTOMY, RIGHT : - FIBROCYSTIC CHANGES WITH SCLEROSING ADENOSIS AND USUAL DUCTAL HYPERPLASIA. - BENIGN LOBULES WITH CALCIFICATIONS. - HEALING BIOPSY SITE. - THERE IS NO EVIDENCE OF MALIGNANCY. - SEE COMMENT. 2. BREAST, EXCISION, RIGHT ADDITIONAL MEDIAL NODULE : - FIBROCYSTIC CHANGES WITH SCLEROSING ADENOSIS AND CALCIFICATIONS. - THERE IS NO EVIDENCE OF MALIGNANCY.  Patient had a right breast cyst that demonstrated atypical cells at that time as well.  Patient has a mother and sister with history of breast cancer. Patient herself was tested and did not have the BRCA1 or BRCA2 gene mutation.  Breast exam:   Physical Exam  Pulmonary/Chest:      Patient will be sent to the radiologist for diagnostic mammogram and ultrasound of both breasts. I am going to request a general surgery consultation for this high-risk patient for possible biopsy consideration.

## 2012-08-10 NOTE — Telephone Encounter (Signed)
Message copied by Aura Camps on Fri Aug 10, 2012  9:00 AM ------      Message from: Ok Edwards      Created: Fri Aug 10, 2012 12:20 AM       Victorino Dike, please schedule diagnostic mammogram and ultrasound on this patient with bilateral breast masses. See my encounter note from today's visit. I would also like for you to schedule an appointment with the general surgeon to see her 1 week after the scans in consultation. Thank you JF

## 2012-08-15 ENCOUNTER — Other Ambulatory Visit: Payer: Self-pay | Admitting: Gynecology

## 2012-08-15 DIAGNOSIS — N6019 Diffuse cystic mastopathy of unspecified breast: Secondary | ICD-10-CM

## 2012-08-20 ENCOUNTER — Encounter: Payer: Self-pay | Admitting: Gynecology

## 2012-08-21 ENCOUNTER — Encounter (INDEPENDENT_AMBULATORY_CARE_PROVIDER_SITE_OTHER): Payer: Self-pay | Admitting: General Surgery

## 2012-08-28 ENCOUNTER — Encounter (INDEPENDENT_AMBULATORY_CARE_PROVIDER_SITE_OTHER): Payer: Self-pay | Admitting: General Surgery

## 2012-08-28 ENCOUNTER — Ambulatory Visit (INDEPENDENT_AMBULATORY_CARE_PROVIDER_SITE_OTHER): Payer: BC Managed Care – PPO | Admitting: General Surgery

## 2012-08-28 VITALS — BP 118/68 | HR 70 | Resp 18 | Ht 64.0 in | Wt 143.0 lb

## 2012-08-28 DIAGNOSIS — N6310 Unspecified lump in the right breast, unspecified quadrant: Secondary | ICD-10-CM

## 2012-08-28 DIAGNOSIS — N6320 Unspecified lump in the left breast, unspecified quadrant: Secondary | ICD-10-CM

## 2012-08-28 DIAGNOSIS — N63 Unspecified lump in unspecified breast: Secondary | ICD-10-CM

## 2012-08-28 NOTE — Progress Notes (Signed)
Subjective:     Patient ID: Isabel Skinner, female   DOB: March 11, 1966, 47 y.o.   MRN: [id number]  HPI 15 yof I know from previous excisional biopsy in 2011 in right breast that was benign.  She has done well since then until recently when she and Dr. Lily Peer noted bilateral breast masses.  In November she noted a left sided lump. This has been causing her some pain and she feels like it was increasing in size.  Then began improving.  On 2/11 she underwent bilateral mmg with finding of extremely dense breasts.  There are numerous microcalfications and a retroareolar density on the left but there is marked decrease in size.  No concerning change on the right.  Left breast u/s shows a .8x1.3 cm hypoechoic mass in the retroareolar position.  Short term interval follow up was recommended.  Review of Systems  Constitutional: Negative for fever, chills and unexpected weight change.  HENT: Negative for hearing loss, congestion, sore throat, trouble swallowing and voice change.   Eyes: Negative for visual disturbance.  Respiratory: Negative for cough and wheezing.   Cardiovascular: Negative for chest pain, palpitations and leg swelling.  Gastrointestinal: Negative for nausea, vomiting, abdominal pain, diarrhea, constipation, blood in stool, abdominal distention and anal bleeding.  Genitourinary: Negative for hematuria, vaginal bleeding and difficulty urinating.  Musculoskeletal: Negative for arthralgias.  Skin: Negative for rash and wound.  Neurological: Negative for seizures, syncope and headaches.  Hematological: Negative for adenopathy. Does not bruise/bleed easily.  Psychiatric/Behavioral: Negative for confusion.       Objective:   Physical Exam  Vitals reviewed. Constitutional: She appears well-developed and well-nourished.  Cardiovascular: Normal rate, regular rhythm and normal heart sounds.   Pulmonary/Chest: Effort normal and breath sounds normal. She has no wheezes. She has no rales. Right  breast exhibits mass. Right breast exhibits no inverted nipple, no nipple discharge, no skin change and no tenderness. Left breast exhibits no inverted nipple.    Lymphadenopathy:    She has no cervical adenopathy.    She has no axillary adenopathy.       Right: No supraclavicular adenopathy present.       Left: No supraclavicular adenopathy present.       Assessment:     Right breast mass Left breast subareolar mass    Plan:     I do palpate a right breast mass and cannot find this was ultrasounded before. I will send her to get u/s of the right breast mass then return. It appears that the left breast mass is getting better and I think is reasonable to follow this.  I will reexamine her when she returns for follow up of right side.

## 2013-08-09 ENCOUNTER — Ambulatory Visit (INDEPENDENT_AMBULATORY_CARE_PROVIDER_SITE_OTHER): Payer: BC Managed Care – PPO | Admitting: Emergency Medicine

## 2013-08-09 ENCOUNTER — Telehealth: Payer: Self-pay | Admitting: Internal Medicine

## 2013-08-09 VITALS — BP 138/72 | HR 86 | Temp 98.4°F | Resp 16 | Ht 64.5 in | Wt 147.0 lb

## 2013-08-09 DIAGNOSIS — J209 Acute bronchitis, unspecified: Secondary | ICD-10-CM

## 2013-08-09 DIAGNOSIS — J018 Other acute sinusitis: Secondary | ICD-10-CM

## 2013-08-09 MED ORDER — PSEUDOEPHEDRINE-GUAIFENESIN ER 60-600 MG PO TB12: 1.0000 | ORAL_TABLET | Freq: Two times a day (BID) | ORAL | Status: DC

## 2013-08-09 MED ORDER — GUAIFENESIN-DM 100-10 MG/5ML PO SYRP: 5.0000 mL | ORAL_SOLUTION | ORAL | Status: DC | PRN

## 2013-08-09 MED ORDER — AMOXICILLIN-POT CLAVULANATE 875-125 MG PO TABS: 1.0000 | ORAL_TABLET | Freq: Two times a day (BID) | ORAL | Status: DC

## 2013-08-09 NOTE — Progress Notes (Signed)
[facility] [address], [place] [address] [phone]  Date:  08/09/2013   Name:  Isabel Skinner   DOB:  08/23/1965   MRN:  [id number]  PCP:  FERNANDEZ,JUAN H, MD    Chief Complaint: Cough and Nasal Congestion   History of Present Illness:  Isabel Skinner is a 47 y.o. very pleasant female patient who presents with the following:  Ill with nasal congestion and drainage for two days.  Developed a cough productive purulent sputum Thursday.  No shortness of breath or wheezing.  No fever but says chilled.  No nausea or vomiting.  No stool change.  No rash or sore throat.  No improvement with over the counter medications or other home remedies. Denies other complaint or health concern today.   Patient Active Problem List   Diagnosis Date Noted  . Mass of breast, left 08/09/2012  . Mass of breast, right 08/09/2012  . Fibroid uterus 05/03/2012  . Hypercholesterolemia 05/03/2012  . Family history of breast cancer in female 04/25/2012  . Family history of diabetes mellitus 04/25/2012  . URI 09/26/2007  . ALLERGIC RHINITIS 09/26/2007  . ELEVATED BLOOD PRESSURE WITHOUT DIAGNOSIS OF HYPERTENSION 09/26/2007    Past Medical History  Diagnosis Date  . Fibroadenoma     RIGHT AXILLA  . BRCA1 negative   . BRCA2 negative     Past Surgical History  Procedure Laterality Date  . Endometrial ablation  03/31/2006    NOVASURE  . Lipoma excision  2002    RIGHT AND LEFT UNDER ARMS  . Breast surgery  09/2009    RIGHT BREAST LUMP- LUMPECTOMY BENIGN.    History  Substance Use Topics  . Smoking status: Never Smoker   . Smokeless tobacco: Never Used  . Alcohol Use: Yes     Comment: SOCIAL    Family History  Problem Relation Age of Onset  . Breast cancer Mother   . Diabetes Father     INSULIN  . Hypertension Father   . Breast cancer Sister     No Known Allergies  Medication list has been reviewed and updated.  Current Outpatient Prescriptions on File Prior to  Visit  Medication Sig Dispense Refill  . azelastine (ASTELIN) 137 MCG/SPRAY nasal spray Place 1 spray into the nose 2 (two) times daily. Use in each nostril as directed       . cetirizine (ZYRTEC) 10 MG tablet Take 10 mg by mouth daily.        . mometasone (NASONEX) 50 MCG/ACT nasal spray Place 2 sprays into the nose daily.        . Azelastine-Fluticasone (DYMISTA) 137-50 MCG/ACT SUSP Place into the nose.      . calcium carbonate (OS-CAL) 600 MG TABS Take 600 mg by mouth 2 (two) times daily with a meal.         No current facility-administered medications on file prior to visit.    Review of Systems:  As per HPI, otherwise negative.    Physical Examination: Filed Vitals:   08/09/13 1638  BP: 138/72  Pulse: 86  Temp: 98.4 F (36.9 C)  Resp: 16   Filed Vitals:   08/09/13 1638  Height: 5' 4.5" (1.638 m)  Weight: 147 lb (66.679 kg)   Body mass index is 24.85 kg/(m^2). Ideal Body Weight: Weight in (lb) to have BMI = 25: 147.6  GEN: WDWN, NAD, Non-toxic, A & O x 3 HEENT: Atraumatic, Normocephalic. Neck supple. No masses, No   LAD. Ears and Nose: No external deformity. CV: RRR, No M/G/R. No JVD. No thrill. No extra heart sounds. PULM: CTA B, no wheezes, crackles, rhonchi. No retractions. No resp. distress. No accessory muscle use. ABD: S, NT, ND, +BS. No rebound. No HSM. EXTR: No c/c/e NEURO Normal gait.  PSYCH: Normally interactive. Conversant. Not depressed or anxious appearing.  Calm demeanor.    Assessment and Plan: Sinusitis Bronchitis augmentin mucinex d Robitussin DM   Signed,  Jeffery Anderson, MD    

## 2013-08-09 NOTE — Telephone Encounter (Signed)
Pt went to urgent care, she will call back to schedule CPE with Dr. Camila Li for reestablish.

## 2013-08-09 NOTE — Telephone Encounter (Signed)
Ok 4 pm Thx 

## 2013-08-09 NOTE — Telephone Encounter (Signed)
Pt request to reestablish, last ov was 2007 w/Plot but saw Dr. Jenny Reichmann 2009. Pt also request to be work in today if possible for cold, cough, congestion. Please advise. [employer] is her insurance.

## 2013-08-09 NOTE — Patient Instructions (Signed)

## 2013-08-16 ENCOUNTER — Other Ambulatory Visit (INDEPENDENT_AMBULATORY_CARE_PROVIDER_SITE_OTHER): Payer: BC Managed Care – PPO

## 2013-08-16 ENCOUNTER — Encounter: Payer: Self-pay | Admitting: Internal Medicine

## 2013-08-16 ENCOUNTER — Ambulatory Visit (INDEPENDENT_AMBULATORY_CARE_PROVIDER_SITE_OTHER): Payer: BC Managed Care – PPO | Admitting: Internal Medicine

## 2013-08-16 VITALS — BP 120/80 | HR 80 | Temp 97.6°F | Resp 16 | Ht 64.0 in | Wt 150.0 lb

## 2013-08-16 DIAGNOSIS — Z23 Encounter for immunization: Secondary | ICD-10-CM

## 2013-08-16 DIAGNOSIS — J309 Allergic rhinitis, unspecified: Secondary | ICD-10-CM

## 2013-08-16 DIAGNOSIS — R7309 Other abnormal glucose: Secondary | ICD-10-CM

## 2013-08-16 DIAGNOSIS — Z803 Family history of malignant neoplasm of breast: Secondary | ICD-10-CM

## 2013-08-16 DIAGNOSIS — R739 Hyperglycemia, unspecified: Secondary | ICD-10-CM

## 2013-08-16 LAB — HEPATIC FUNCTION PANEL
ALT: 11 U/L (ref 0–35)
AST: 17 U/L (ref 0–37)
Albumin: 3.7 g/dL (ref 3.5–5.2)
Alkaline Phosphatase: 43 U/L (ref 39–117)
BILIRUBIN TOTAL: 0.5 mg/dL (ref 0.3–1.2)
Bilirubin, Direct: 0 mg/dL (ref 0.0–0.3)
TOTAL PROTEIN: 6.8 g/dL (ref 6.0–8.3)

## 2013-08-16 LAB — CBC WITH DIFFERENTIAL/PLATELET
BASOS ABS: 0 10*3/uL (ref 0.0–0.1)
BASOS PCT: 0.3 % (ref 0.0–3.0)
EOS ABS: 0.2 10*3/uL (ref 0.0–0.7)
Eosinophils Relative: 3 % (ref 0.0–5.0)
HCT: 36.8 % (ref 36.0–46.0)
HEMOGLOBIN: 11.6 g/dL — AB (ref 12.0–15.0)
LYMPHS PCT: 33.2 % (ref 12.0–46.0)
Lymphs Abs: 2.5 10*3/uL (ref 0.7–4.0)
MCHC: 31.5 g/dL (ref 30.0–36.0)
MCV: 88.5 fl (ref 78.0–100.0)
MONOS PCT: 7.6 % (ref 3.0–12.0)
Monocytes Absolute: 0.6 10*3/uL (ref 0.1–1.0)
NEUTROS ABS: 4.2 10*3/uL (ref 1.4–7.7)
NEUTROS PCT: 55.9 % (ref 43.0–77.0)
Platelets: 332 10*3/uL (ref 150.0–400.0)
RBC: 4.16 Mil/uL (ref 3.87–5.11)
RDW: 13.5 % (ref 11.5–14.6)
WBC: 7.6 10*3/uL (ref 4.5–10.5)

## 2013-08-16 LAB — BASIC METABOLIC PANEL
BUN: 8 mg/dL (ref 6–23)
CHLORIDE: 105 meq/L (ref 96–112)
CO2: 26 mEq/L (ref 19–32)
CREATININE: 0.6 mg/dL (ref 0.4–1.2)
Calcium: 8.5 mg/dL (ref 8.4–10.5)
GFR: 134.1 mL/min (ref >60.00)
GLUCOSE: 88 mg/dL (ref 70–99)
POTASSIUM: 4.3 meq/L (ref 3.5–5.1)
Sodium: 138 mEq/L (ref 135–145)

## 2013-08-16 LAB — URINALYSIS
Bilirubin Urine: NEGATIVE
Hgb urine dipstick: NEGATIVE
Ketones, ur: NEGATIVE
Leukocytes, UA: NEGATIVE
NITRITE: NEGATIVE
PH: 7 (ref 5.0–8.0)
SPECIFIC GRAVITY, URINE: 1.01 (ref 1.000–1.030)
Total Protein, Urine: NEGATIVE
UROBILINOGEN UA: 0.2 (ref 0.0–1.0)
Urine Glucose: NEGATIVE

## 2013-08-16 LAB — TSH: TSH: 0.49 u[IU]/mL (ref 0.35–5.50)

## 2013-08-16 LAB — HEMOGLOBIN A1C: Hgb A1c MFr Bld: 6 % (ref 4.6–6.5)

## 2013-08-16 MED ORDER — DESLORATADINE-PSEUDOEPHED ER 2.5-120 MG PO TB12: 1.0000 | ORAL_TABLET | Freq: Two times a day (BID) | ORAL | Status: DC

## 2013-08-16 MED ORDER — VITAMIN D 1000 UNITS PO TABS: 1000.0000 [IU] | ORAL_TABLET | Freq: Every day | ORAL | Status: DC

## 2013-08-16 NOTE — Assessment & Plan Note (Signed)
Dairy free

## 2013-08-16 NOTE — Patient Instructions (Signed)
   Milk free trial (no milk, ice cream, cheese and yogurt) for 4-6 weeks. OK to use almond, coconut, rice milk. "Almond breeze" brand tastes good.  

## 2013-08-16 NOTE — Progress Notes (Signed)
Subjective:     HPI  New pt  C/o allergies, stopped up nose, family h/o breast cancer  Review of Systems  Constitutional: Negative for chills, activity change, appetite change, fatigue and unexpected weight change.  HENT: Positive for congestion, postnasal drip, rhinorrhea and sinus pressure. Negative for mouth sores.   Eyes: Negative for visual disturbance.  Respiratory: Negative for cough and chest tightness.   Gastrointestinal: Negative for nausea and abdominal pain.  Genitourinary: Negative for frequency, difficulty urinating and vaginal pain.  Musculoskeletal: Negative for back pain and gait problem.  Skin: Negative for pallor and rash.  Neurological: Negative for dizziness, tremors, weakness, numbness and headaches.  Psychiatric/Behavioral: Negative for suicidal ideas, confusion and sleep disturbance. The patient is not nervous/anxious.        Objective:   Physical Exam  Constitutional: She appears well-developed. No distress.  HENT:  Head: Normocephalic.  Right Ear: External ear normal.  Left Ear: External ear normal.  Mouth/Throat: Oropharynx is clear and moist.  Swollen nasal mucosa, white d/c  Eyes: Conjunctivae are normal. Pupils are equal, round, and reactive to light. Right eye exhibits no discharge. Left eye exhibits no discharge.  Neck: Normal range of motion. Neck supple. No JVD present. No tracheal deviation present. No thyromegaly present.  Cardiovascular: Normal rate, regular rhythm and normal heart sounds.   Pulmonary/Chest: No stridor. No respiratory distress. She has no wheezes.  Abdominal: Soft. Bowel sounds are normal. She exhibits no distension and no mass. There is no tenderness. There is no rebound and no guarding.  Musculoskeletal: She exhibits no edema and no tenderness.  Lymphadenopathy:    She has no cervical adenopathy.  Neurological: She displays normal reflexes. No cranial nerve deficit. She exhibits normal muscle tone. Coordination normal.   Skin: No rash noted. No erythema.  Psychiatric: She has a normal mood and affect. Her behavior is normal. Judgment and thought content normal.       Assessment:         Plan:

## 2013-08-16 NOTE — Progress Notes (Signed)
Pre visit review using our clinic review tool, if applicable. No additional management support is needed unless otherwise documented below in the visit note. 

## 2013-08-17 NOTE — Assessment & Plan Note (Signed)
Vit D 1000 iu/d

## 2014-02-19 ENCOUNTER — Telehealth: Payer: Self-pay

## 2014-02-19 NOTE — Telephone Encounter (Signed)
Patient called stating that she had left breast pain and "a significant sized lump" in her left breast. She called [facility] and they scheduled her a diagnostic mammo for Tuesday, Aug 25 8:15am but told her to call here for order.   Patient questions will she need to be seen first. I called her back and left detailed message cell vm that office visit will be needed for breast exam so that Dr. Moshe Salisbury can order her mammo.  I offered appt for 10:30am in the morning and asked her to call me back.

## 2014-02-21 ENCOUNTER — Encounter: Payer: Self-pay | Admitting: Gynecology

## 2014-02-21 ENCOUNTER — Telehealth: Payer: Self-pay | Admitting: *Deleted

## 2014-02-21 ENCOUNTER — Ambulatory Visit (INDEPENDENT_AMBULATORY_CARE_PROVIDER_SITE_OTHER): Payer: BC Managed Care – PPO | Admitting: Gynecology

## 2014-02-21 VITALS — BP 126/82

## 2014-02-21 DIAGNOSIS — N912 Amenorrhea, unspecified: Secondary | ICD-10-CM

## 2014-02-21 DIAGNOSIS — R7309 Other abnormal glucose: Secondary | ICD-10-CM

## 2014-02-21 DIAGNOSIS — R739 Hyperglycemia, unspecified: Secondary | ICD-10-CM

## 2014-02-21 DIAGNOSIS — N6310 Unspecified lump in the right breast, unspecified quadrant: Secondary | ICD-10-CM | POA: Insufficient documentation

## 2014-02-21 DIAGNOSIS — N63 Unspecified lump in unspecified breast: Secondary | ICD-10-CM

## 2014-02-21 DIAGNOSIS — N951 Menopausal and female climacteric states: Secondary | ICD-10-CM

## 2014-02-21 DIAGNOSIS — J309 Allergic rhinitis, unspecified: Secondary | ICD-10-CM

## 2014-02-21 MED ORDER — PAROXETINE HCL 10 MG PO TABS: ORAL_TABLET | ORAL | Status: DC

## 2014-02-21 NOTE — Patient Instructions (Addendum)
Perimenopause Perimenopause is the time when your body begins to move into the menopause (no menstrual period for 12 straight months). It is a natural process. Perimenopause can begin 2-8 years before the menopause and usually lasts for 1 year after the menopause. During this time, your ovaries may or may not produce an egg. The ovaries vary in their production of estrogen and progesterone hormones each month. This can cause irregular menstrual periods, difficulty getting pregnant, vaginal bleeding between periods, and uncomfortable symptoms. CAUSES  Irregular production of the ovarian hormones, estrogen and progesterone, and not ovulating every month.  Other causes include:  Tumor of the pituitary gland in the brain.  Medical disease that affects the ovaries.  Radiation treatment.  Chemotherapy.  Unknown causes.  Heavy smoking and excessive alcohol intake can bring on perimenopause sooner. SIGNS AND SYMPTOMS   Hot flashes.  Night sweats.  Irregular menstrual periods.  Decreased sex drive.  Vaginal dryness.  Headaches.  Mood swings.  Depression.  Memory problems.  Irritability.  Tiredness.  Weight gain.  Trouble getting pregnant.  The beginning of losing bone cells (osteoporosis).  The beginning of hardening of the arteries (atherosclerosis). DIAGNOSIS  Your health care provider will make a diagnosis by analyzing your age, menstrual history, and symptoms. He or she will do a physical exam and note any changes in your body, especially your female organs. Female hormone tests may or may not be helpful depending on the amount of female hormones you produce and when you produce them. However, other hormone tests may be helpful to rule out other problems. TREATMENT  In some cases, no treatment is needed. The decision on whether treatment is necessary during the perimenopause should be made by you and your health care provider based on how the symptoms are affecting you  and your lifestyle. Various treatments are available, such as:  Treating individual symptoms with a specific medicine for that symptom.  Herbal medicines that can help specific symptoms.  Counseling.  Group therapy. HOME CARE INSTRUCTIONS   Keep track of your menstrual periods (when they occur, how heavy they are, how long between periods, and how long they last) as well as your symptoms and when they started.  Only take over-the-counter or prescription medicines as directed by your health care provider.  Sleep and rest.  Exercise.  Eat a diet that contains calcium (good for your bones) and soy (acts like the estrogen hormone).  Do not smoke.  Avoid alcoholic beverages.  Take vitamin supplements as recommended by your health care provider. Taking vitamin E may help in certain cases.  Take calcium and vitamin D supplements to help prevent bone loss.  Group therapy is sometimes helpful.  Acupuncture may help in some cases. SEEK MEDICAL CARE IF:   You have questions about any symptoms you are having.  You need a referral to a specialist (gynecologist, psychiatrist, or psychologist). SEEK IMMEDIATE MEDICAL CARE IF:   You have vaginal bleeding.  Your period lasts longer than 8 days.  Your periods are recurring sooner than 21 days.  You have bleeding after intercourse.  You have severe depression.  You have pain when you urinate.  You have severe headaches.  You have vision problems. Document Released: 07/28/2004 Document Revised: 04/10/2013 Document Reviewed: 01/17/2013 [facility] Patient Information 2015 [place], [place]. This information is not intended to replace advice given to you by your health care provider. Make sure you discuss any questions you have with your health care provider. Paroxetine capsules What is this  medicine? PAROXETINE (pa ROX e teen) is used to treat hot flashes due to menopause. This medicine may be used for other purposes; ask your health  care provider or pharmacist if you have questions. COMMON BRAND NAME(S): Brisdelle What should I tell my health care provider before I take this medicine? They need to know if you have any of these conditions: -bleeding disorders -glaucoma -heart disease -kidney disease -liver disease -low levels of sodium in the blood -mania or bipolar disorder -seizures -suicidal thoughts, plans, or attempt; a previous suicide attempt by you or a family member -take MAOIs like Carbex, Eldepryl, Marplan, Nardil, and Parnate -take medicines that treat or prevent blood clots -an unusual or allergic reaction to paroxetine, other medicines, foods, dyes, or preservatives -pregnant or trying to get pregnant -breast-feeding How should I use this medicine? Take this medicine by mouth once daily at bedtime. Follow the directions on the prescription label. This medicine can be taken with or without food. Take your medicine at regular intervals. Do not take your medicine more often than directed. A special MedGuide will be given to you by the pharmacist with each prescription and refill. Be sure to read this information carefully each time. Overdosage: If you think you've taken too much of this medicine contact a poison control center or emergency room at once. Overdosage: If you think you have taken too much of this medicine contact a poison control center or emergency room at once. NOTE: This medicine is only for you. Do not share this medicine with others. What if I miss a dose? If you miss a dose, take it as soon as you can. If it is almost time for your next dose, take only that dose. Do not take double or extra doses. What may interact with this medicine? Do not take this medicine with any of the following medications: -linezolid -MAOIs like Carbex, Eldepryl, Marplan, Nardil, and Parnate -methylene blue (injected into a vein) -pimozide -thioridazine This medicine may also interact with the following  medications: -alcohol -aspirin and aspirin-like medicines -atomoxetine -certain medicines for depression, anxiety, or psychotic disturbances -certain medicines for irregular heart beat like propafenone, flecainide, encainide, and quinidine -certain medicines for migraine headache like almotriptan, eletriptan, frovatriptan, naratriptan, rizatriptan, sumatriptan, zolmitriptan -cimetidine -digoxin -diuretics -fentanyl -fosamprenavir -furazolidone -isoniazid -lithium -medicines that treat or prevent blood clots like warfarin, enoxaparin, and dalteparin -medicines for sleep -NSAIDs, medicines for pain and inflammation, like ibuprofen or naproxen -phenobarbital -phenytoin -procarbazine -rasagiline -ritonavir -supplements like St. John's wort, kava kava, valerian -tamoxifen -tramadol -tryptophan This list may not describe all possible interactions. Give your health care provider a list of all the medicines, herbs, non-prescription drugs, or dietary supplements you use. Also tell them if you smoke, drink alcohol, or use illegal drugs. Some items may interact with your medicine. What should I watch for while using this medicine? Tell your doctor or healthcare professional if your symptoms do not start to get better or if they get worse. Visit your doctor or health care professional for regular checks on your progress. Patients and their families should watch out for new or worsening thoughts of suicide or depression. Also watch out for sudden changes in feelings such as feeling anxious, agitated, panicky, irritable, hostile, aggressive, impulsive, severely restless, overly excited and hyperactive, or not being able to sleep. If this happens, especially at the beginning of treatment or after a change in dose, call your health care professional. Dennis Bast may get drowsy or dizzy. Do not drive, use machinery, or  do anything that needs mental alertness until you know how this medicine affects you. Do not  stand or sit up quickly, especially if you are an older patient. This reduces the risk of dizzy or fainting spells. Alcohol may interfere with the effect of this medicine. Avoid alcoholic drinks. Your mouth may get dry. Chewing sugarless gum, sucking hard candy and drinking plenty of water will help. Contact your doctor if the problem does not go away or is severe. Women should inform their doctor if they wish to become pregnant or think they might be pregnant. There is a potential for serious side effects to an unborn child. Talk to your health care professional or pharmacist for more information. Do not become pregnant while taking this medicine. What side effects may I notice from receiving this medicine? Side effects that you should report to your doctor or health care professional as soon as possible: -allergic reactions like skin rash, itching or hives, swelling of the face, lips, or tongue -changes in emotions or moods -confusion -depression -feeling faint or lightheaded, falls -seizures -suicidal thoughts or actions -unusual bleeding or bruising -unusually weak or tired -weakness Side effects that usually do not require medical attention (Report these to your doctor or health care professional if they continue or are bothersome.): -change in sex drive or performance -fatigue -drowsiness -headache -insomnia -nausea/vomiting -upset stomach This list may not describe all possible side effects. Call your doctor for medical advice about side effects. You may report side effects to FDA at 1-800-FDA-1088. Where should I keep my medicine? Keep out of the reach of children. Store at room temperature between 20 and 25 degrees C (68 and 77 degrees F). Throw away any unused medicine after the expiration date. NOTE: This sheet is a summary. It may not cover all possible information. If you have questions about this medicine, talk to your doctor, pharmacist, or health care provider.  2015,  Elsevier/Gold Standard. (2012-02-20 12:26:17)

## 2014-02-21 NOTE — Telephone Encounter (Signed)
Pt scheduled on 02/25/14 @ 8:15 am at [facility], order faxed

## 2014-02-21 NOTE — Progress Notes (Signed)
   patient presented to the office today complaining of increasing tenderness and size of mass of right breast. She noticed that a few weeks ago. Patient was seen in the office on February of 2014 with the following note:  Patient had stated that she had a breast mass in 2013. In that same year she had a mammogram that demonstrated an enlarged mass sub-areolar region of the left breast that on ultrasound it was a simple 3.7 cm cyst with no suspicious calcifications or distortion. Right breast subtle postsurgical change with no change from previous study. Patient had history of right lumpectomy in 2011 with the following pathology report:  1. BREAST, LUMPECTOMY, RIGHT : - FIBROCYSTIC CHANGES WITH SCLEROSING ADENOSIS AND USUAL DUCTAL HYPERPLASIA. - BENIGN LOBULES WITH CALCIFICATIONS. - HEALING BIOPSY SITE. - THERE IS NO EVIDENCE OF MALIGNANCY. - SEE COMMENT. 2. BREAST, EXCISION, RIGHT ADDITIONAL MEDIAL NODULE : - FIBROCYSTIC CHANGES WITH SCLEROSING ADENOSIS AND CALCIFICATIONS. - THERE IS NO EVIDENCE OF MALIGNANCY.  Patient had a right breast cyst that demonstrated atypical cells at that time as well.  Patient has a mother and sister with history of breast cancer. Patient herself was tested and did not have the BRCA1 or BRCA2 gene mutation.  On February 2014 she had bilateral 3-D mammogram and the findings were as follows: Breasts were dense. Numerous scattered microcalcifications were noted bilaterally.Numerous scattered calcifications were seen bilaterally. They had described that it had decreased in size. A hypoechoic mass 0.8x1.3 cm 12:00 left breat retro areolar region was described and no perfusion seen on doppler. This was site of previously seen cyst. It was also described as having decreased in size from previous measurement of 3.7 cm in diameter. Current finding were believed to have been residual related to resolving infected/inflammed cyst. It was recommended that a follow up ultrasound in  6 months be undertaken but she did not return.  Physical Exam  Pulmonary/Chest:     Assessment/plan: Patient with 4 x 4 centimeter right breast mass originating retroareolar region from the 5:00 to the 11:00 position. Patient will be referred to the radiologist for diagnostic mammogram of the right breast and screening mammogram of the left with possible ultrasound.patient with perimenopausal symptoms of hot flashes we will check an [place] and TSH today. For her vasomotor symptoms she is going to be placed on Paroxitene 10 mg tablet which she will break in half and take half a tablet daily. We will hold off on any HRT until evaluation of the breast is completed and after for confirmation on [facility].

## 2014-02-21 NOTE — Telephone Encounter (Signed)
Message copied by Thamas Jaegers on Fri Feb 21, 2014  2:32 PM ------      Message from: Terrance Mass      Created: Fri Feb 21, 2014 12:06 PM       Anderson Malta, please schedule the following diagnostic mammogram and ultrasound on this patient with right breast mass:             Patient with 4 x 4 centimeter right breast mass originating retroareolar region from the 5:00 to the 11:00 position. Patient will be referred to the radiologist for diagnostic mammogram of the right breast and screening mammogram of the left with possible ultrasound. ------

## 2014-02-22 LAB — FOLLICLE STIMULATING HORMONE: FSH: 80.7 m[IU]/mL

## 2014-02-22 LAB — TSH: TSH: 1.303 u[IU]/mL (ref 0.350–4.500)

## 2014-02-22 LAB — VITAMIN D 25 HYDROXY (VIT D DEFICIENCY, FRACTURES): VIT D 25 HYDROXY: 18 ng/mL — AB (ref 30–89)

## 2014-02-25 ENCOUNTER — Other Ambulatory Visit: Payer: Self-pay

## 2014-02-25 ENCOUNTER — Encounter: Payer: Self-pay | Admitting: Gynecology

## 2014-02-27 ENCOUNTER — Telehealth: Payer: Self-pay | Admitting: Internal Medicine

## 2014-02-27 NOTE — Telephone Encounter (Signed)
Pt called back and states she wants to schedule an appointment with Dr. Toney Rakes before she sees Dr. Alain Marion.

## 2014-02-28 NOTE — Progress Notes (Signed)
Patient wants to schedule an appointment with Dr. Toney Rakes before she sees Dr. Alain Marion.

## 2014-03-04 ENCOUNTER — Encounter: Payer: Self-pay | Admitting: Gynecology

## 2014-04-23 ENCOUNTER — Ambulatory Visit (INDEPENDENT_AMBULATORY_CARE_PROVIDER_SITE_OTHER): Payer: BC Managed Care – PPO | Admitting: Gynecology

## 2014-04-23 ENCOUNTER — Encounter: Payer: Self-pay | Admitting: Gynecology

## 2014-04-23 ENCOUNTER — Other Ambulatory Visit (HOSPITAL_COMMUNITY)
Admission: RE | Admit: 2014-04-23 | Discharge: 2014-04-23 | Disposition: A | Discharge status: Home / Self Care | Payer: BC Managed Care – PPO | Source: Ambulatory Visit | Attending: Gynecology | Admitting: Gynecology

## 2014-04-23 VITALS — BP 132/86 | Ht 64.0 in | Wt 155.0 lb

## 2014-04-23 DIAGNOSIS — Z1151 Encounter for screening for human papillomavirus (HPV): Secondary | ICD-10-CM | POA: Diagnosis present

## 2014-04-23 DIAGNOSIS — Z01419 Encounter for gynecological examination (general) (routine) without abnormal findings: Secondary | ICD-10-CM | POA: Insufficient documentation

## 2014-04-23 DIAGNOSIS — Z803 Family history of malignant neoplasm of breast: Secondary | ICD-10-CM

## 2014-04-23 DIAGNOSIS — N951 Menopausal and female climacteric states: Secondary | ICD-10-CM

## 2014-04-23 DIAGNOSIS — Z23 Encounter for immunization: Secondary | ICD-10-CM

## 2014-04-23 MED ORDER — PAROXETINE HCL 10 MG PO TABS
ORAL_TABLET | ORAL | Status: DC
Start: 2014-04-23 — End: 2015-05-25

## 2014-04-23 NOTE — Progress Notes (Signed)
Isabel Skinner 1966/02/01 [id number]   History:    48 y.o.  for annual gyn exam with no complaints today. Patient last year was started on Paxil 10 mg daily which has helped her vasomotor symptoms. Patient several years ago had an endometrial ablation has had no menses since. Patient in August of this year was found to have a 4 x 4 centimeter right breast mass originating retroareolar region from the 5:00 to the 11:00 position. Patient will be referred to the radiologist who did an ultrasound-guided biopsy in the following pathology report was reported:  Diagnosis Breast, right, needle core biopsy, 9 o'clock - BENIGN BREAST TISSUE, SEE COMMENT. - NEGATIVE FOR ATYPIA OR MALIGNANCY.  Recommendation was followup mammogram in one year. In 2011 patient had a right lumpectomy with pathology report having demonstrated fibrocystic changes with sclerosing adenosis and calcifications and no evidence of malignancy.Mother and sister both had breast cancer. She has been tested and does not have the BRCA one BRCA2 gene.   Past medical history,surgical history, family history and social history were all reviewed and documented in the EPIC chart.  Gynecologic History No LMP recorded. Patient is not currently having periods (Reason: Perimenopausal). Contraception: vasectomy Last Pap: 2012. Results were: normal Last mammogram: See above. Results were: See above  Obstetric History OB History  Gravida Para Term Preterm AB SAB TAB Ectopic Multiple Living  _0 # Outcome Date GA Lbr Len/2nd Weight Sex Delivery Anes PTL Lv  3 SAB           2 PAR     F SVD   Y  1 PAR     M SVD   Y       ROS: A ROS was performed and pertinent positives and negatives are included in the history.  GENERAL: No fevers or chills. HEENT: No change in vision, no earache, sore throat or sinus congestion. NECK: No pain or stiffness. CARDIOVASCULAR: No chest pain or pressure. No palpitations. PULMONARY: No shortness  of breath, cough or wheeze. GASTROINTESTINAL: No abdominal pain, nausea, vomiting or diarrhea, melena or bright red blood per rectum. GENITOURINARY: No urinary frequency, urgency, hesitancy or dysuria. MUSCULOSKELETAL: No joint or muscle pain, no back pain, no recent trauma. DERMATOLOGIC: No rash, no itching, no lesions. ENDOCRINE: No polyuria, polydipsia, no heat or cold intolerance. No recent change in weight. HEMATOLOGICAL: No anemia or easy bruising or bleeding. NEUROLOGIC: No headache, seizures, numbness, tingling or weakness. PSYCHIATRIC: No depression, no loss of interest in normal activity or change in sleep pattern.     Exam: chaperone present  BP 132/86  Ht 5' 4" (1.626 m)  Wt 155 lb (70.308 kg)  BMI 26.59 kg/m2  Body mass index is 26.59 kg/(m^2).  General appearance : Well developed well nourished female. No acute distress HEENT: Neck supple, trachea midline, no carotid bruits, no thyroidmegaly Lungs: Clear to auscultation, no rhonchi or wheezes, or rib retractions  Heart: Regular rate and rhythm, no murmurs or gallops Breast:Examined in sitting and supine position were symmetrical in appearance, no palpable masses or tenderness,  no skin retraction, no nipple inversion, no nipple discharge, no skin discoloration, no axillary or supraclavicular lymphadenopathy Abdomen: no palpable masses or tenderness, no rebound or guarding Extremities: no edema or skin discoloration or tenderness  Pelvic:  Bartholin, Urethra, Skene Glands: Within normal limits             Vagina: No gross  lesions or discharge  Cervix: No gross lesions or discharge  Uterus  anteverted, normal size, shape and consistency, non-tender and mobile  Adnexa  Without masses or tenderness  Anus and perineum  normal   Rectovaginal  normal sphincter tone without palpated masses or tenderness             Hemoccult not indicated     Assessment/Plan:  48 y.o. female for annual exam doing well on Paxil 10 mg daily for  vasomotor symptoms. Patient perimenopausal no menses prior history of endometrial ablation. Patient received Tdap vaccine today. Pap smear done today. Patient to followup with PCP for blood work. Patient was reminded on the importance of monthly breast exams. We discussed importance of calcium vitamin D and regular exercise for osteoporosis prevention.   Terrance Mass MD, 10:49 AM 04/23/2014

## 2014-04-23 NOTE — Patient Instructions (Signed)

## 2014-04-24 LAB — CYTOLOGY - PAP

## 2014-05-05 ENCOUNTER — Encounter: Payer: Self-pay | Admitting: Gynecology

## 2015-05-13 ENCOUNTER — Encounter: Payer: BC Managed Care – PPO | Admitting: Internal Medicine

## 2015-05-25 ENCOUNTER — Other Ambulatory Visit (INDEPENDENT_AMBULATORY_CARE_PROVIDER_SITE_OTHER): Payer: BC Managed Care – PPO

## 2015-05-25 ENCOUNTER — Encounter: Payer: Self-pay | Admitting: Internal Medicine

## 2015-05-25 ENCOUNTER — Ambulatory Visit (INDEPENDENT_AMBULATORY_CARE_PROVIDER_SITE_OTHER): Payer: BC Managed Care – PPO | Admitting: Internal Medicine

## 2015-05-25 VITALS — BP 130/98 | HR 89 | Ht 64.0 in | Wt 149.0 lb

## 2015-05-25 DIAGNOSIS — J3089 Other allergic rhinitis: Secondary | ICD-10-CM

## 2015-05-25 DIAGNOSIS — E559 Vitamin D deficiency, unspecified: Secondary | ICD-10-CM

## 2015-05-25 DIAGNOSIS — N951 Menopausal and female climacteric states: Secondary | ICD-10-CM

## 2015-05-25 DIAGNOSIS — Z0000 Encounter for general adult medical examination without abnormal findings: Secondary | ICD-10-CM

## 2015-05-25 DIAGNOSIS — R7309 Other abnormal glucose: Secondary | ICD-10-CM

## 2015-05-25 DIAGNOSIS — Z23 Encounter for immunization: Secondary | ICD-10-CM

## 2015-05-25 LAB — BASIC METABOLIC PANEL
BUN: 12 mg/dL (ref 6–23)
CHLORIDE: 103 meq/L (ref 96–112)
CO2: 27 meq/L (ref 19–32)
CREATININE: 0.67 mg/dL (ref 0.40–1.20)
Calcium: 9.5 mg/dL (ref 8.4–10.5)
GFR: 119.48 mL/min (ref >60.00)
GLUCOSE: 78 mg/dL (ref 70–99)
Potassium: 3.8 mEq/L (ref 3.5–5.1)
Sodium: 138 mEq/L (ref 135–145)

## 2015-05-25 LAB — LIPID PANEL
CHOLESTEROL: 219 mg/dL — AB (ref 0–200)
HDL: 75.9 mg/dL (ref >39.00)
LDL CALC: 129 mg/dL — AB (ref 0–99)
NonHDL: 143.53
Total CHOL/HDL Ratio: 3
Triglycerides: 73 mg/dL (ref 0.0–149.0)
VLDL: 14.6 mg/dL (ref 0.0–40.0)

## 2015-05-25 LAB — HEPATIC FUNCTION PANEL
ALBUMIN: 4.6 g/dL (ref 3.5–5.2)
ALT: 12 U/L (ref 0–35)
AST: 18 U/L (ref 0–37)
Alkaline Phosphatase: 61 U/L (ref 39–117)
Bilirubin, Direct: 0.1 mg/dL (ref 0.0–0.3)
TOTAL PROTEIN: 7.6 g/dL (ref 6.0–8.3)
Total Bilirubin: 0.4 mg/dL (ref 0.2–1.2)

## 2015-05-25 LAB — URINALYSIS
Bilirubin Urine: NEGATIVE
Hgb urine dipstick: NEGATIVE
Ketones, ur: NEGATIVE
Leukocytes, UA: NEGATIVE
Nitrite: NEGATIVE
PH: 6 (ref 5.0–8.0)
TOTAL PROTEIN, URINE-UPE24: NEGATIVE
Urine Glucose: NEGATIVE
Urobilinogen, UA: 0.2 (ref 0.0–1.0)

## 2015-05-25 LAB — VITAMIN D 25 HYDROXY (VIT D DEFICIENCY, FRACTURES): VITD: 16.12 ng/mL — AB (ref 30.00–100.00)

## 2015-05-25 LAB — HEMOGLOBIN A1C: Hgb A1c MFr Bld: 5.8 % (ref 4.6–6.5)

## 2015-05-25 LAB — TSH: TSH: 1.65 u[IU]/mL (ref 0.35–4.50)

## 2015-05-25 MED ORDER — AZELASTINE-FLUTICASONE 137-50 MCG/ACT NA SUSP: 1.0000 | NASAL | Status: DC

## 2015-05-25 MED ORDER — VITAMIN D 1000 UNITS PO TABS: 1000.0000 [IU] | ORAL_TABLET | Freq: Every day | ORAL | Status: AC

## 2015-05-25 MED ORDER — MONTELUKAST SODIUM 10 MG PO TABS: 10.0000 mg | ORAL_TABLET | Freq: Every day | ORAL | Status: DC

## 2015-05-25 MED ORDER — ESCITALOPRAM OXALATE 5 MG PO TABS: 5.0000 mg | ORAL_TABLET | Freq: Every day | ORAL | Status: DC

## 2015-05-25 MED ORDER — LEVOCETIRIZINE DIHYDROCHLORIDE 5 MG PO TABS: 5.0000 mg | ORAL_TABLET | Freq: Every evening | ORAL | Status: DC

## 2015-05-25 NOTE — Progress Notes (Signed)
Subjective:  Patient ID: Isabel Skinner, female    DOB: [birth year]  Age: 49 y.o. MRN: [id number]  CC: Annual Exam   HPI ASNA MONEGRO presents for a well exam. C/o allergies. C/o hot flashes. Paxil caused wt gain  Outpatient Prescriptions Prior to Visit  Medication Sig Dispense Refill  . PARoxetine (PAXIL) 10 MG tablet Take 1/2 tablet daily (Patient not taking: Reported on 05/25/2015) 30 tablet 11   No facility-administered medications prior to visit.    ROS Review of Systems  Constitutional: Positive for unexpected weight change. Negative for chills, activity change, appetite change and fatigue.  HENT: Negative for congestion, mouth sores and sinus pressure.   Eyes: Negative for visual disturbance.  Respiratory: Negative for cough and chest tightness.   Gastrointestinal: Negative for nausea and abdominal pain.  Genitourinary: Negative for frequency, difficulty urinating and vaginal pain.  Musculoskeletal: Negative for back pain and gait problem.  Skin: Negative for pallor and rash.  Neurological: Negative for dizziness, tremors, weakness, numbness and headaches.  Psychiatric/Behavioral: Negative for suicidal ideas, confusion and sleep disturbance. The patient is not nervous/anxious and is not hyperactive.     Objective:  BP 130/98 mmHg  Pulse 89  Ht 5\' 4"  (1.626 m)  Wt 149 lb (67.586 kg)  BMI 25.56 kg/m2  SpO2 99%  BP Readings from Last 3 Encounters:  05/25/15 130/98  04/23/14 132/86  02/21/14 126/82    Wt Readings from Last 3 Encounters:  05/25/15 149 lb (67.586 kg)  04/23/14 155 lb (70.308 kg)  08/16/13 150 lb (68.04 kg)    Physical Exam  Constitutional: She appears well-developed. No distress.  HENT:  Head: Normocephalic.  Right Ear: External ear normal.  Left Ear: External ear normal.  Nose: Nose normal.  Mouth/Throat: Oropharynx is clear and moist.  Eyes: Conjunctivae are normal. Pupils are equal, round, and reactive to light. Right eye exhibits no  discharge. Left eye exhibits no discharge.  Neck: Normal range of motion. Neck supple. No JVD present. No tracheal deviation present. No thyromegaly present.  Cardiovascular: Normal rate, regular rhythm and normal heart sounds.   Pulmonary/Chest: No stridor. No respiratory distress. She has no wheezes.  Abdominal: Soft. Bowel sounds are normal. She exhibits no distension and no mass. There is no tenderness. There is no rebound and no guarding.  Musculoskeletal: She exhibits no edema or tenderness.  Lymphadenopathy:    She has no cervical adenopathy.  Neurological: She displays normal reflexes. No cranial nerve deficit. She exhibits normal muscle tone. Coordination normal.  Skin: No rash noted. No erythema.  Psychiatric: She has a normal mood and affect. Her behavior is normal. Judgment and thought content normal.  swollen nasal mucosa  Lab Results  Component Value Date   WBC 7.6 08/16/2013   HGB 11.6* 08/16/2013   HCT 36.8 08/16/2013   PLT 332.0 08/16/2013   GLUCOSE 88 08/16/2013   CHOL 176 05/07/2012   TRIG 75 05/07/2012   HDL 54 05/07/2012   LDLCALC 107* 05/07/2012   ALT 11 08/16/2013   AST 17 08/16/2013   NA 138 08/16/2013   K 4.3 08/16/2013   CL 105 08/16/2013   CREATININE 0.6 08/16/2013   BUN 8 08/16/2013   CO2 26 08/16/2013   TSH 1.303 02/21/2014   HGBA1C 6.0 08/16/2013    No results found.  Assessment & Plan:   Akera was seen today for annual exam.  Diagnoses and all orders for this visit:  Well adult exam -  VITAMIN D 25 Hydroxy (Vit-D Deficiency, Fractures); Future -     CBC with Differential/Platelet; Future -     Basic metabolic panel; Future -     Hemoglobin A1c; Future -     Hepatic function panel; Future -     Lipid panel; Future -     TSH; Future -     Urinalysis; Future -     Hepatitis C antibody; Future  Vitamin D deficiency -     VITAMIN D 25 Hydroxy (Vit-D Deficiency, Fractures); Future  Elevated glucose -     Hemoglobin A1c;  Future  Need for influenza vaccination -     Flu Vaccine QUAD 36+ mos IM  Other orders -     levocetirizine (XYZAL) 5 MG tablet; Take 1 tablet (5 mg total) by mouth every evening. -     Azelastine-Fluticasone (DYMISTA) 137-50 MCG/ACT SUSP; Place 1 Act into the nose 1 day or 1 dose. -     montelukast (SINGULAIR) 10 MG tablet; Take 1 tablet (10 mg total) by mouth daily. -     escitalopram (LEXAPRO) 5 MG tablet; Take 1 tablet (5 mg total) by mouth daily. -     cholecalciferol (VITAMIN D) 1000 UNITS tablet; Take 1 tablet (1,000 Units total) by mouth daily.  I have discontinued Ms. Pantel PARoxetine. I am also having her start on levocetirizine, Azelastine-Fluticasone, montelukast, escitalopram, and cholecalciferol.  Meds ordered this encounter  Medications  . levocetirizine (XYZAL) 5 MG tablet    Sig: Take 1 tablet (5 mg total) by mouth every evening.    Dispense:  30 tablet    Refill:  11  . Azelastine-Fluticasone (DYMISTA) 137-50 MCG/ACT SUSP    Sig: Place 1 Act into the nose 1 day or 1 dose.    Dispense:  23 g    Refill:  11  . montelukast (SINGULAIR) 10 MG tablet    Sig: Take 1 tablet (10 mg total) by mouth daily.    Dispense:  90 tablet    Refill:  3  . escitalopram (LEXAPRO) 5 MG tablet    Sig: Take 1 tablet (5 mg total) by mouth daily.    Dispense:  90 tablet    Refill:  3  . cholecalciferol (VITAMIN D) 1000 UNITS tablet    Sig: Take 1 tablet (1,000 Units total) by mouth daily.    Dispense:  100 tablet    Refill:  3     Follow-up: Return in about 1 year (around 05/24/2016) for Wellness Exam.  Walker Kehr, MD

## 2015-05-25 NOTE — Assessment & Plan Note (Signed)
Will try Lexapro 

## 2015-05-25 NOTE — Assessment & Plan Note (Signed)
Worse. Will try Dymista and Xyzal if affordable. Added Singulair. Counsellor. Pt had allergy tests 2 years ago

## 2015-05-25 NOTE — Assessment & Plan Note (Signed)
We discussed age appropriate health related issues, including available/recomended screening tests and vaccinations. We discussed a need for adhering to healthy diet and exercise. Labs/EKG were reviewed/ordered. All questions were answered.   

## 2015-05-25 NOTE — Progress Notes (Signed)
Pre visit review using our clinic review tool, if applicable. No additional management support is needed unless otherwise documented below in the visit note. 

## 2015-05-26 LAB — CBC WITH DIFFERENTIAL/PLATELET
BASOS ABS: 0 10*3/uL (ref 0.0–0.1)
Basophils Relative: 0.4 % (ref 0.0–3.0)
EOS PCT: 4.8 % (ref 0.0–5.0)
Eosinophils Absolute: 0.4 10*3/uL (ref 0.0–0.7)
HEMATOCRIT: 39 % (ref 36.0–46.0)
Hemoglobin: 12.7 g/dL (ref 12.0–15.0)
Lymphocytes Relative: 33.3 % (ref 12.0–46.0)
Lymphs Abs: 2.8 10*3/uL (ref 0.7–4.0)
MCHC: 32.6 g/dL (ref 30.0–36.0)
MCV: 86.6 fl (ref 78.0–100.0)
MONOS PCT: 7 % (ref 3.0–12.0)
Monocytes Absolute: 0.6 10*3/uL (ref 0.1–1.0)
Neutro Abs: 4.5 10*3/uL (ref 1.4–7.7)
Neutrophils Relative %: 54.5 % (ref 43.0–77.0)
Platelets: 308 10*3/uL (ref 150.0–400.0)
RBC: 4.5 Mil/uL (ref 3.87–5.11)
RDW: 13.9 % (ref 11.5–15.5)
WBC: 8.3 10*3/uL (ref 4.0–10.5)

## 2015-05-26 LAB — HEPATITIS C ANTIBODY: HCV Ab: NEGATIVE

## 2015-05-27 ENCOUNTER — Encounter: Payer: Self-pay | Admitting: Gynecology

## 2015-05-27 ENCOUNTER — Other Ambulatory Visit: Payer: Self-pay | Admitting: Internal Medicine

## 2015-05-27 ENCOUNTER — Ambulatory Visit (INDEPENDENT_AMBULATORY_CARE_PROVIDER_SITE_OTHER): Payer: BC Managed Care – PPO | Admitting: Gynecology

## 2015-05-27 VITALS — BP 126/84 | Ht 64.5 in | Wt 148.0 lb

## 2015-05-27 DIAGNOSIS — N63 Unspecified lump in breast: Secondary | ICD-10-CM

## 2015-05-27 DIAGNOSIS — H01136 Eczematous dermatitis of left eye, unspecified eyelid: Secondary | ICD-10-CM

## 2015-05-27 DIAGNOSIS — N951 Menopausal and female climacteric states: Secondary | ICD-10-CM

## 2015-05-27 DIAGNOSIS — Z01419 Encounter for gynecological examination (general) (routine) without abnormal findings: Secondary | ICD-10-CM

## 2015-05-27 DIAGNOSIS — N6310 Unspecified lump in the right breast, unspecified quadrant: Secondary | ICD-10-CM

## 2015-05-27 MED ORDER — MOMETASONE FUROATE 0.1 % EX CREA: 1.0000 "application " | TOPICAL_CREAM | Freq: Every day | CUTANEOUS | Status: DC

## 2015-05-27 MED ORDER — ERGOCALCIFEROL 1.25 MG (50000 UT) PO CAPS: 50000.0000 [IU] | ORAL_CAPSULE | ORAL | Status: DC

## 2015-05-27 NOTE — Patient Instructions (Addendum)
Mometasone skin cream, lotion, or ointment What is this medicine? MOMETASONE (moe MET a sone) is a corticosteroid. It is used to treat skin problems that may cause itching, redness, and swelling. This medicine may be used for other purposes; ask your health care provider or pharmacist if you have questions. What should I tell my health care provider before I take this medicine? They need to know if you have any of these conditions: -acne or rosacea -any type of active infection -large areas of burned or damaged skin -skin wasting or thinning -an unusual or allergic reaction to mometasone, steroids, other medicines, foods, dyes, or preservatives -pregnant or trying to get pregnant -breast-feeding How should I use this medicine? This medicine is for external use only. Do not take by mouth. Follow the directions on the prescription label. Wash your hands before and after use. Apply a thin film to the affected area and rub in gently. Do not bandage or wrap the skin being treated unless directed to do so by your doctor or health care professional. Do not use on healthy skin or over large areas of skin. Do not get this medicine in your eyes. If you do, rinse it out with plenty of cool tap water. Use your doses at regular intervals. Do not use your medicine more often than directed or for a longer period of time than ordered by your doctor or health care professional. To do so may increase the chance of side effects. Talk to your pediatrician regarding the use of this medicine in children. While this drug may be prescribed for children as young as 44 years of age for selected conditions, precautions do apply. Do not use this medicine on the diaper area of a child. Diapers or plastic pants are considered air tight bandages and may increase the amount of medicine that is absorbed and increase the risk of serious side effects. Elderly patients are more likely to have damaged skin through aging, and this may  increase side effects. This medicine should only be used for brief periods and infrequently in older patients. Overdosage: If you think you have taken too much of this medicine contact a poison control center or emergency room at once. NOTE: This medicine is only for you. Do not share this medicine with others. What if I miss a dose? If you miss a dose, use it as soon as you can. If it is almost time for your next dose, use only that dose. Do not use double or extra doses. What may interact with this medicine? Interactions are not expected. Do not use any other skin products without telling your doctor or health care professional. This list may not describe all possible interactions. Give your health care provider a list of all the medicines, herbs, non-prescription drugs, or dietary supplements you use. Also tell them if you smoke, drink alcohol, or use illegal drugs. Some items may interact with your medicine. What should I watch for while using this medicine? Tell your doctor or health care professional if your symptoms do not get better within 2 weeks. This medicine may increase your risk of getting an infection. Tell your doctor or health care professional if you are around anyone with measles or chickenpox, or if you develop sores or blisters that do not heal properly. What side effects may I notice from receiving this medicine? Side effects that you should report to your doctor or health care professional as soon as possible: -painful, red, pus filled blisters in hair  follicles -severe burning and continued itching of the skin -thinning of the skin with easy bruising Side effects that usually do not require medical attention (report to your doctor or health care professional if they continue or are bothersome): -burning, itching, or irritation of the skin -increased redness or scaling of the skin This list may not describe all possible side effects. Call your doctor for medical advice about  side effects. You may report side effects to FDA at 1-800-FDA-1088. Where should I keep my medicine? Keep out of the reach of children. Store at room temperature between 15 and 30 degrees C (59 and 86 degrees F) away from heat and direct light. Do not freeze. Throw away any unused medicine after the expiration date. NOTE: This sheet is a summary. It may not cover all possible information. If you have questions about this medicine, talk to your doctor, pharmacist, or health care provider.    2016, Elsevier/Gold Standard. (2008-01-07 14:39:23) Breast Cyst A breast cyst is a sac in the breast that is filled with fluid. Breast cysts are common in women. Women can have one or many cysts. When the breasts contain many cysts, it is usually due to a noncancerous (benign) condition called fibrocystic change. These lumps form under the influence of female hormones (estrogen and progesterone). The lumps are most often located in the upper, outer portion of the breast. They are often more swollen, painful, and tender before your period starts. They usually disappear after menopause, unless you are on hormone therapy.  There are several types of cysts:  Macrocyst. This is a cyst that is about 2 in. (5.1 cm) in diameter.   Microcyst. This is a tiny cyst that you cannot feel but can be seen with a mammogram or an ultrasound.   Galactocele. This is a cyst containing milk that may develop if you suddenly stop breastfeeding.   Sebaceous cyst of the skin. This type of cyst is not in the breast tissue itself. Breast cysts do not increase your risk of breast cancer. However, they must be monitored closely because they can be cancerous.  CAUSES  It is not known exactly what causes a breast cyst to form. Possible causes include:  An overgrowth of milk glands and connective tissue in the breast can block the milk glands, causing them to fill with fluid.   Scar tissue in the breast from previous surgery may  block the glands, causing a cyst.  RISK FACTORS Estrogen may influence the development of a breast cyst.  SIGNS AND SYMPTOMS   Feeling a smooth, round, soft lump (like a grape) in the breast that is easily moveable.   Breast discomfort or pain.  Increase in size of the lump before your menstrual period and decrease in its size after your menstrual period.  DIAGNOSIS  A cyst can be felt during a physical exam by your health care provider. A breast X-ray exam (mammogram) and ultrasonography will be done to confirm the diagnosis. Fluid may be removed from the cyst with a needle (fine needle aspiration) to make sure the cyst is not cancerous.  TREATMENT  Treatment may not be necessary. Your health care provider may monitor the cyst to see if it goes away on its own. If treatment is needed, it may include:  Hormone treatment.   Needle aspiration. There is a chance of the cyst coming back after aspiration.   Surgery to remove the whole cyst.  HOME CARE INSTRUCTIONS   Keep all follow-up appointments with  your health care provider.  See your health care provider regularly:  Get a yearly exam by your health care provider.  Have a clinical breast exam by a health care provider every 1-3 years if you are 44-56 years of age. After age 20 years, you should have the exam every year.   Get mammogram tests as directed by your health care provider.   Understand the normal appearance and feel of your breasts and perform breast self-exams.   Only take over-the-counter or prescription medicines as directed by your health care provider.   Wear a supportive bra, especially when exercising.   Avoid caffeine.   Reduce your salt intake, especially before your menstrual period. Too much salt can cause fluid retention, breast swelling, and discomfort.  SEEK MEDICAL CARE IF:   You feel, or think you feel, a lump in your breast.   You notice that both breasts look or feel different  than usual.   Your breast is still causing pain after your menstrual period is over.   You need medicine for breast pain and swelling that occurs with your menstrual period.  SEEK IMMEDIATE MEDICAL CARE IF:   You have severe pain, tenderness, redness, or warmth in your breast.   You have nipple discharge or bleeding.   Your breast lump becomes hard and painful.   You find new lumps or bumps that were not there before.   You feel lumps in your armpit (axilla).   You notice dimpling or wrinkling of the breast or nipple.   You have a fever.  MAKE SURE YOU:  Understand these instructions.  Will watch your condition.  Will get help right away if you are not doing well or get worse.   This information is not intended to replace advice given to you by your health care provider. Make sure you discuss any questions you have with your health care provider.   Document Released: 06/20/2005 Document Revised: 02/20/2013 Document Reviewed: 01/17/2013 Elsevier Interactive Patient Education [employer].

## 2015-05-27 NOTE — Progress Notes (Signed)
Isabel Skinner July 08, 1965 [id number]   History:    49 y.o.  for annual gyn exam who presented with 2 complaints today. #1 rash underneath the left eyelid #2} mass  Patient 2014 had been placed on Paxil 10 mg daily for vasomotor symptoms but patient discontinued because it increase her cravings for food. She still has vasomotor symptoms. But she is adamant ongoing on any form of hormonal replacement therapy. Patient on August 2015 was found to have a right breast mass retroareolar region from the 5 to the 11:00 position and was referred to the radiologist who did an ultrasound guided biopsy for which the pathology report demonstrated the following: Diagnosis Breast, right, needle core biopsy, 9 o'clock - BENIGN BREAST TISSUE, SEE COMMENT. - NEGATIVE FOR ATYPIA OR MALIGNANCY.  Recommendation was followup mammogram in one year. In 2011 patient had a right lumpectomy with pathology report having demonstrated fibrocystic changes with sclerosing adenosis and calcifications and no evidence of malignancy.Mother and sister both had breast cancer. She has been tested and does not have the BRCA one BRCA2 gene. Patient with no previous history of any abnormal Pap smears  Patient still has not had her colonoscopy. Dr. Alain Marion has been doing her blood work and her flu vaccine is up-to-date.  Past medical history,surgical history, family history and social history were all reviewed and documented in the EPIC chart.  Gynecologic History No LMP recorded. Patient is not currently having periods (Reason: Perimenopausal). Contraception: post menopausal status Last Pap: 2015. Results were: normal Last mammogram: See above. Results were: See above  Obstetric History OB History  Gravida Para Term Preterm AB SAB TAB Ectopic Multiple Living  _0 # Outcome Date GA Lbr Len/2nd Weight Sex Delivery Anes PTL Lv  3 SAB           2 Para     F Vag-Spont   Y  1 Para     M Vag-Spont   Y        ROS: A ROS was performed and pertinent positives and negatives are included in the history.  GENERAL: No fevers or chills. HEENT: No change in vision, no earache, sore throat or sinus congestion. NECK: No pain or stiffness. CARDIOVASCULAR: No chest pain or pressure. No palpitations. PULMONARY: No shortness of breath, cough or wheeze. GASTROINTESTINAL: No abdominal pain, nausea, vomiting or diarrhea, melena or bright red blood per rectum. GENITOURINARY: No urinary frequency, urgency, hesitancy or dysuria. MUSCULOSKELETAL: No joint or muscle pain, no back pain, no recent trauma. DERMATOLOGIC: No rash, no itching, no lesions. ENDOCRINE: No polyuria, polydipsia, no heat or cold intolerance. No recent change in weight. HEMATOLOGICAL: No anemia or easy bruising or bleeding. NEUROLOGIC: No headache, seizures, numbness, tingling or weakness. PSYCHIATRIC: No depression, no loss of interest in normal activity or change in sleep pattern.     Exam: chaperone present  BP 126/84 mmHg  Ht 5' 4.5" (1.638 m)  Wt 148 lb (67.132 kg)  BMI 25.02 kg/m2  Body mass index is 25.02 kg/(m^2).  General appearance : Well developed well nourished female. No acute distress HEENT: Eyes: Inferior portion of the left eyelid there appears to be eczema  Neck supple, trachea midline, no carotid bruits, no thyroidmegaly Lungs: Clear to auscultation, no rhonchi or wheezes, or rib retractions  Heart: Regular rate and rhythm, no murmurs or gallops Breast:Examined in sitting and supine position right breast mass located between the 10 and  11:00 position periareolar region there was a 2-1/2 cm tender irregular firm mass Abdomen: no palpable masses or tenderness, no rebound or guarding Extremities: no edema or skin discoloration or tenderness  Pelvic:  Bartholin, Urethra, Skene Glands: Within normal limits             Vagina: No gross lesions or discharge  Cervix: No gross lesions or discharge  Uterus  anteverted, normal  size, shape and consistency, non-tender and mobile  Adnexa  Without masses or tenderness  Anus and perineum  normal   Rectovaginal  normal sphincter tone without palpated masses or tenderness             Hemoccult screening colonoscopy this year     Assessment/Plan:  49 y.o. female for annual exam will be referred to the radiologist for diagnostic mammogram of the right breast and screening mammogram on the left breast. Patient was provided with the name of a dermatologist for follow-up on her skin lesion. She was also given the name of community gastroenterologist to schedule her colonoscopy. Next year she will need a bone density study. Pap smear not indicated this year. For her inferior eyelid eczema she will be prescribed  Elo 0.1% cream to apply twice a day for 7-10 days until she sees a dermatologist.con    Terrance Mass MD, 3:33 PM 05/27/2015

## 2015-06-01 ENCOUNTER — Telehealth: Payer: Self-pay | Admitting: *Deleted

## 2015-06-01 NOTE — Telephone Encounter (Signed)
Appointment on 06/10/15 @ 9:15am, order faxed left message for pt to call.

## 2015-06-01 NOTE — Telephone Encounter (Signed)
-----   Message from Terrance Mass, MD sent at 05/27/2015  3:12 PM EST ----- Please make appointment for this patient at [facility] (prior patient). Needs diagnostic mammogram of right breast. She has a 2 1/2 cm multi lobulated mass from the 10-12 o'clock position firm and tender periareolar region. Will need screening mammogram of left. Patient with history of ovarian cyst and previous biopsies.

## 2015-06-02 NOTE — Telephone Encounter (Signed)
Pt aware.

## 2015-06-15 ENCOUNTER — Encounter: Payer: Self-pay | Admitting: Gynecology

## 2016-06-13 ENCOUNTER — Ambulatory Visit (INDEPENDENT_AMBULATORY_CARE_PROVIDER_SITE_OTHER): Payer: BC Managed Care – PPO | Admitting: Family Medicine

## 2016-06-13 VITALS — BP 122/72 | HR 100 | Temp 98.6°F | Resp 17 | Ht 65.5 in | Wt 166.0 lb

## 2016-06-13 DIAGNOSIS — J302 Other seasonal allergic rhinitis: Secondary | ICD-10-CM

## 2016-06-13 DIAGNOSIS — R232 Flushing: Secondary | ICD-10-CM | POA: Diagnosis not present

## 2016-06-13 DIAGNOSIS — R0981 Nasal congestion: Secondary | ICD-10-CM

## 2016-06-13 MED ORDER — AMOXICILLIN-POT CLAVULANATE 875-125 MG PO TABS: 1.0000 | ORAL_TABLET | Freq: Two times a day (BID) | ORAL | 0 refills | Status: DC

## 2016-06-13 MED ORDER — FLUTICASONE PROPIONATE 50 MCG/ACT NA SUSP: 2.0000 | Freq: Every day | NASAL | 6 refills | Status: DC

## 2016-06-13 MED ORDER — CETIRIZINE HCL 10 MG PO TABS: 10.0000 mg | ORAL_TABLET | Freq: Every day | ORAL | 11 refills | Status: DC

## 2016-06-13 MED ORDER — FLUCONAZOLE 150 MG PO TABS: 150.0000 mg | ORAL_TABLET | Freq: Once | ORAL | 0 refills | Status: AC

## 2016-06-13 NOTE — Patient Instructions (Addendum)
     IF you received an x-ray today, you will receive an invoice from [facility]. Please contact [facility] at [phone] with questions or concerns regarding your invoice.   IF you received labwork today, you will receive an invoice from [employer]. Please contact [facility] at [phone] with questions or concerns regarding your invoice.   Our billing staff will not be able to assist you with questions regarding bills from these companies.  You will be contacted with the lab results as soon as they are available. The fastest way to get your results is to activate your My Chart account. Instructions are located on the last page of this paperwork. If you have not heard from [place] regarding the results in 2 weeks, please contact this office.      Sinusitis, Adult Sinusitis is soreness and inflammation of your sinuses. Sinuses are hollow spaces in the bones around your face. They are located:  Around your eyes.  In the middle of your forehead.  Behind your nose.  In your cheekbones. Your sinuses and nasal passages are lined with a stringy fluid (mucus). Mucus normally drains out of your sinuses. When your nasal tissues get inflamed or swollen, the mucus can get trapped or blocked so air cannot flow through your sinuses. This lets bacteria, viruses, and funguses grow, and that leads to infection. Follow these instructions at home: Medicines  Take, use, or apply over-the-counter and prescription medicines only as told by your doctor. These may include nasal sprays.  If you were prescribed an antibiotic medicine, take it as told by your doctor. Do not stop taking the antibiotic even if you start to feel better. Hydrate and Humidify  Drink enough water to keep your pee (urine) clear or pale yellow.  Use a cool mist humidifier to keep the humidity level in your home above 50%.  Breathe in steam for 10-15 minutes, 3-4 times a day or as told  by your doctor. You can do this in the bathroom while a hot shower is running.  Try not to spend time in cool or dry air. Rest  Rest as much as possible.  Sleep with your head raised (elevated).  Make sure to get enough sleep each night. General instructions  Put a warm, moist washcloth on your face 3-4 times a day or as told by your doctor. This will help with discomfort.  Wash your hands often with soap and water. If there is no soap and water, use hand sanitizer.  Do not smoke. Avoid being around people who are smoking (secondhand smoke).  Keep all follow-up visits as told by your doctor. This is important. Contact a doctor if:  You have a fever.  Your symptoms get worse.  Your symptoms do not get better within 10 days. Get help right away if:  You have a very bad headache.  You cannot stop throwing up (vomiting).  You have pain or swelling around your face or eyes.  You have trouble seeing.  You feel confused.  Your neck is stiff.  You have trouble breathing. This information is not intended to replace advice given to you by your health care provider. Make sure you discuss any questions you have with your health care provider. Document Released: 12/07/2007 Document Revised: 02/14/2016 Document Reviewed: 04/15/2015 Elsevier Interactive Patient Education  2017 [employer].

## 2016-06-13 NOTE — Progress Notes (Signed)
Chief Complaint  Patient presents with  . Sore Throat  . URI  . Headache    HPI  Pt reports that she has been having chest congestion Head congestion with pain in her ears She has a dry nonproductive cough She reports muscle aches Pain is intermittent for 3 weeks She reports that she has been taking otc medications such as tylenol flu, corticetin She denies fever but reports chills  She reports that though her cough is nonproductive it comes in spells but there is no posttussive emesis. She reports that she has vomited a few times a week ago  She denies skin rash No wheezing  She does not have a history of asthma She is not a smoker She reports that she has seasonal allergies and takes singulair  She reports that this is her first work up. She denies sick contacts. She is a school principal  She reports hot flashes off and on due to perimenopause  Past Medical History:  Diagnosis Date  . Allergy   . BRCA1 negative   . BRCA2 negative   . Fibroadenoma    RIGHT AXILLA    Current Outpatient Prescriptions  Medication Sig Dispense Refill  . montelukast (SINGULAIR) 10 MG tablet Take 1 tablet (10 mg total) by mouth daily. 90 tablet 3  . amoxicillin-clavulanate (AUGMENTIN) 875-125 MG tablet Take 1 tablet by mouth 2 (two) times daily. 20 tablet 0  . cetirizine (ZYRTEC) 10 MG tablet Take 1 tablet (10 mg total) by mouth daily. 30 tablet 11  . fluconazole (DIFLUCAN) 150 MG tablet Take 1 tablet (150 mg total) by mouth once. Repeat dose in 3 days 2 tablet 0  . fluticasone (FLONASE) 50 MCG/ACT nasal spray Place 2 sprays into both nostrils daily. 16 g 6   No current facility-administered medications for this visit.     Allergies: No Known Allergies  Past Surgical History:  Procedure Laterality Date  . BREAST SURGERY  09/2009   RIGHT BREAST LUMP- LUMPECTOMY BENIGN.  Marland Kitchen ENDOMETRIAL ABLATION  03/31/2006   NOVASURE  . LIPOMA EXCISION  2002   RIGHT AND LEFT UNDER ARMS     Social History   Social History  . Marital status: Married    Spouse name: N/A  . Number of children: N/A  . Years of education: N/A   Social History Main Topics  . Smoking status: Never Smoker  . Smokeless tobacco: Never Used  . Alcohol use 0.0 oz/week     Comment: SOCIAL  . Drug use: No  . Sexual activity: Yes    Partners: Male     Comment: PATIENTS PARTNER WITH VASECTOMY   Other Topics Concern  . None   Social History Narrative  . None    ROS  Objective: Vitals:   06/13/16 1107  BP: 122/72  Pulse: 100  Resp: 17  Temp: 98.6 F (37 C)  TempSrc: Oral  SpO2: 99%  Weight: 166 lb (75.3 kg)  Height: 5' 5.5" (1.664 m)    Physical Exam  General: alert, oriented, in NAD Head: normocephalic, atraumatic, + ethmoid sinus tenderness bilaterally Eyes: EOM intact, no scleral icterus or conjunctival injection Ears: TM clear bilaterally Nose: right nare blocked by inflammation and edema Throat: no pharyngeal exudate or erythema Lymph: no posterior auricular, submental or cervical lymph adenopathy Heart: normal rate, normal sinus rhythm, no murmurs Lungs: clear to auscultation bilaterally, no wheezing    Assessment and Plan Isabel Skinner was seen today for sore throat, uri and headache.  Diagnoses and  all orders for this visit:  Sinus congestion- discussed that sinus congestion can progress to sinusitis If no improvement she should take augmentin then take diflucan if she develops yeast infection  Chronic seasonal allergic rhinitis, unspecified trigger- continue singulair  Add flonase and zyrtec  Hot flashes- since pt has hot flashes discussed that she should monitor her overall symptoms and if she is worsening to take augmentin  Other orders -     cetirizine (ZYRTEC) 10 MG tablet; Take 1 tablet (10 mg total) by mouth daily. -     fluticasone (FLONASE) 50 MCG/ACT nasal spray; Place 2 sprays into both nostrils daily. -     amoxicillin-clavulanate (AUGMENTIN) 875-125  MG tablet; Take 1 tablet by mouth 2 (two) times daily. -     fluconazole (DIFLUCAN) 150 MG tablet; Take 1 tablet (150 mg total) by mouth once. Repeat dose in 3 days     Isabel Skinner Skinner A Isabel Skinner Skinner

## 2016-09-28 ENCOUNTER — Encounter: Payer: Self-pay | Admitting: Gynecology

## 2016-09-28 ENCOUNTER — Other Ambulatory Visit: Payer: Self-pay | Admitting: Radiology

## 2016-09-29 ENCOUNTER — Encounter: Payer: Self-pay | Admitting: Anesthesiology

## 2016-10-14 ENCOUNTER — Encounter: Payer: Self-pay | Admitting: Gynecology

## 2016-10-14 ENCOUNTER — Ambulatory Visit (INDEPENDENT_AMBULATORY_CARE_PROVIDER_SITE_OTHER): Payer: BC Managed Care – PPO | Admitting: Gynecology

## 2016-10-14 VITALS — BP 128/86 | Ht 65.5 in | Wt 172.0 lb

## 2016-10-14 DIAGNOSIS — N907 Vulvar cyst: Secondary | ICD-10-CM | POA: Diagnosis not present

## 2016-10-14 DIAGNOSIS — N9089 Other specified noninflammatory disorders of vulva and perineum: Secondary | ICD-10-CM

## 2016-10-14 DIAGNOSIS — N909 Noninflammatory disorder of vulva and perineum, unspecified: Secondary | ICD-10-CM

## 2016-10-14 MED ORDER — DOXYCYCLINE HYCLATE 100 MG PO CAPS: 100.0000 mg | ORAL_CAPSULE | Freq: Two times a day (BID) | ORAL | 0 refills | Status: DC

## 2016-10-14 NOTE — Progress Notes (Signed)
   Patient is a 51 year old that presented to the office stating for the past few days she had noted a raised area on her right labial side and started to drain. Prior to that for several years she had also noted a nodularity in this area. She denies any fever, chills, nausea or vomiting, no change in sexual partners and no vaginal discharge. She has not been seen the office since 2016  Exam: Gen. appearance well-developed well-nourished female anxious because of recent finding and symptoms Pelvic: Bartholin urethra Skene glands within normal limits External genitalia midportion of right labia majora a large sebaceous cyst draining on its own was noted with keratin material foul odor extruding from the area. This was removed and with a curved hemostat the area was cleaned along with hydrogen peroxide and a Nu Gauze was placed into the defect for her to remove tomorrow.  Prior to debridement culture for MRSA was obtained.  Assessment/plan: Right labia majora sebaceous cyst culture obtained for prophylaxis for MRSA she'll be started on Vibramycin 100 mg twice a day for 2 weeks. She will return to the office at that time for follow-up as well as a time that she do for annual exam. She was instructed to apply Neosporin twice a day to the area and to wash with antibacterial soap.

## 2016-10-14 NOTE — Patient Instructions (Signed)
Doxycycline tablets or capsules What is this medicine? DOXYCYCLINE (dox i SYE kleen) is a tetracycline antibiotic. It kills certain bacteria or stops their growth. It is used to treat many kinds of infections, like dental, skin, respiratory, and urinary tract infections. It also treats acne, Lyme disease, malaria, and certain sexually transmitted infections. This medicine may be used for other purposes; ask your health care provider or pharmacist if you have questions. COMMON BRAND NAME(S): Acticlate, Adoxa, Adoxa CK, Adoxa Pak, Adoxa TT, Alodox, Avidoxy, Doxal, Mondoxyne NL, Monodox, Morgidox 1x, Morgidox 1x Kit, Morgidox 2x, Morgidox 2x Kit, NutriDox, Ocudox, TARGADOX, Vibra-Tabs, Vibramycin What should I tell my health care provider before I take this medicine? They need to know if you have any of these conditions: -liver disease -long exposure to sunlight like working outdoors -stomach problems like colitis -an unusual or allergic reaction to doxycycline, tetracycline antibiotics, other medicines, foods, dyes, or preservatives -pregnant or trying to get pregnant -breast-feeding How should I use this medicine? Take this medicine by mouth with a full glass of water. Follow the directions on the prescription label. It is best to take this medicine without food, but if it upsets your stomach take it with food. Take your medicine at regular intervals. Do not take your medicine more often than directed. Take all of your medicine as directed even if you think you are better. Do not skip doses or stop your medicine early. Talk to your pediatrician regarding the use of this medicine in children. While this drug may be prescribed for selected conditions, precautions do apply. Overdosage: If you think you have taken too much of this medicine contact a poison control center or emergency room at once. NOTE: This medicine is only for you. Do not share this medicine with others. What if I miss a dose? If you  miss a dose, take it as soon as you can. If it is almost time for your next dose, take only that dose. Do not take double or extra doses. What may interact with this medicine? -antacids -barbiturates -birth control pills -bismuth subsalicylate -carbamazepine -methoxyflurane -other antibiotics -phenytoin -vitamins that contain iron -warfarin This list may not describe all possible interactions. Give your health care provider a list of all the medicines, herbs, non-prescription drugs, or dietary supplements you use. Also tell them if you smoke, drink alcohol, or use illegal drugs. Some items may interact with your medicine. What should I watch for while using this medicine? Tell your doctor or health care professional if your symptoms do not improve. Do not treat diarrhea with over the counter products. Contact your doctor if you have diarrhea that lasts more than 2 days or if it is severe and watery. Do not take this medicine just before going to bed. It may not dissolve properly when you lay down and can cause pain in your throat. Drink plenty of fluids while taking this medicine to also help reduce irritation in your throat. This medicine can make you more sensitive to the sun. Keep out of the sun. If you cannot avoid being in the sun, wear protective clothing and use sunscreen. Do not use sun lamps or tanning beds/booths. Birth control pills may not work properly while you are taking this medicine. Talk to your doctor about using an extra method of birth control. If you are being treated for a sexually transmitted infection, avoid sexual contact until you have finished your treatment. Your sexual partner may also need treatment. Avoid antacids, aluminum, calcium, magnesium, and  iron products for 4 hours before and 2 hours after taking a dose of this medicine. If you are using this medicine to prevent malaria, you should still protect yourself from contact with mosquitos. Stay in screened-in  areas, use mosquito nets, keep your body covered, and use an insect repellent. What side effects may I notice from receiving this medicine? Side effects that you should report to your doctor or health care professional as soon as possible: -allergic reactions like skin rash, itching or hives, swelling of the face, lips, or tongue -difficulty breathing -fever -itching in the rectal or genital area -pain on swallowing -redness, blistering, peeling or loosening of the skin, including inside the mouth -severe stomach pain or cramps -unusual bleeding or bruising -unusually weak or tired -yellowing of the eyes or skin Side effects that usually do not require medical attention (report to your doctor or health care professional if they continue or are bothersome): -diarrhea -loss of appetite -nausea, vomiting This list may not describe all possible side effects. Call your doctor for medical advice about side effects. You may report side effects to FDA at 1-800-FDA-1088. Where should I keep my medicine? Keep out of the reach of children. Store at room temperature, below 30 degrees C (86 degrees F). Protect from light. Keep container tightly closed. Throw away any unused medicine after the expiration date. Taking this medicine after the expiration date can make you seriously ill. NOTE: This sheet is a summary. It may not cover all possible information. If you have questions about this medicine, talk to your doctor, pharmacist, or health care provider.  2018 Elsevier/Gold Standard (2015-07-22 17:11:22) Epidermal Cyst An epidermal cyst is a small, painless lump under your skin. It may be called an epidermal inclusion cyst or an infundibular cyst. The cyst contains a grayish-white, bad-smelling substance (keratin). It is important not to pop epidermal cysts yourself. These cysts are usually harmless (benign), but they can get infected. Symptoms of infection may  include:  Redness.  Inflammation.  Tenderness.  Warmth.  Fever.  A grayish-white, bad-smelling substance draining from the cyst.  Pus draining from the cyst. Follow these instructions at home:  Take over-the-counter and prescription medicines only as told by your doctor.  If you were prescribed an antibiotic, use it as told by your doctor. Do not stop using the antibiotic even if you start to feel better.  Keep the area around your cyst clean and dry.  Wear loose, dry clothing.  Do not try to pop your cyst.  Avoid touching your cyst.  Check your cyst every day for signs of infection.  Keep all follow-up visits as told by your doctor. This is important. How is this prevented?  Wear clean, dry, clothing.  Avoid wearing tight clothing.  Keep your skin clean and dry. Shower or take baths every day.  Wash your body with a benzoyl peroxide wash when you shower or bathe. Contact a health care provider if:  Your cyst has symptoms of infection.  Your condition is not improving or is getting worse.  You have a cyst that looks different from other cysts you have had.  You have a fever. Get help right away if:  Redness spreads from the cyst into the surrounding area. This information is not intended to replace advice given to you by your health care provider. Make sure you discuss any questions you have with your health care provider. Document Released: 07/28/2004 Document Revised: 02/17/2016 Document Reviewed: 04/22/2015 Elsevier Interactive Patient Education  2017 [employer].

## 2016-10-14 NOTE — Addendum Note (Signed)
Addended by: Dorothyann Gibbs on: 10/14/2016 11:37 AM   Modules accepted: Orders

## 2016-10-17 LAB — WOUND CULTURE
Gram Stain: NONE SEEN
Organism ID, Bacteria: NORMAL

## 2016-10-28 ENCOUNTER — Ambulatory Visit (INDEPENDENT_AMBULATORY_CARE_PROVIDER_SITE_OTHER): Payer: BC Managed Care – PPO | Admitting: Gynecology

## 2016-10-28 ENCOUNTER — Encounter: Payer: Self-pay | Admitting: Gynecology

## 2016-10-28 VITALS — BP 118/76 | Ht 64.0 in | Wt 175.0 lb

## 2016-10-28 DIAGNOSIS — Z780 Asymptomatic menopausal state: Secondary | ICD-10-CM

## 2016-10-28 DIAGNOSIS — Z803 Family history of malignant neoplasm of breast: Secondary | ICD-10-CM

## 2016-10-28 DIAGNOSIS — Z01419 Encounter for gynecological examination (general) (routine) without abnormal findings: Secondary | ICD-10-CM | POA: Diagnosis not present

## 2016-10-28 NOTE — Addendum Note (Signed)
Addended by: Nelva Nay on: 10/28/2016 03:38 PM   Modules accepted: Orders

## 2016-10-28 NOTE — Patient Instructions (Signed)
Bone Densitometry Bone densitometry is an imaging test that uses a special X-ray to measure the amount of calcium and other minerals in your bones (bone density). This test is also known as a bone mineral density test or dual-energy X-ray absorptiometry (DXA). The test can measure bone density at your hip and your spine. It is similar to having a regular X-ray. You may have this test to:  Diagnose a condition that causes weak or thin bones (osteoporosis).  Predict your risk of a broken bone (fracture).  Determine how well osteoporosis treatment is working. Tell a health care provider about:  Any allergies you have.  All medicines you are taking, including vitamins, herbs, eye drops, creams, and over-the-counter medicines.  Any problems you or family members have had with anesthetic medicines.  Any blood disorders you have.  Any surgeries you have had.  Any medical conditions you have.  Possibility of pregnancy.  Any other medical test you had within the previous 14 days that used contrast material. What are the risks? Generally, this is a safe procedure. However, problems can occur and may include the following:  This test exposes you to a very small amount of radiation.  The risks of radiation exposure may be greater to unborn children. What happens before the procedure?  Do not take any calcium supplements for 24 hours before having the test. You can otherwise eat and drink what you usually do.  Take off all metal jewelry, eyeglasses, dental appliances, and any other metal objects. What happens during the procedure?  You may lie on an exam table. There will be an X-ray generator below you and an imaging device above you.  Other devices, such as boxes or braces, may be used to position your body properly for the scan.  You will need to lie still while the machine slowly scans your body.  The images will show up on a computer monitor. What happens after the  procedure? You may need more testing at a later time. This information is not intended to replace advice given to you by your health care provider. Make sure you discuss any questions you have with your health care provider. Document Released: 07/12/2004 Document Revised: 11/26/2015 Document Reviewed: 11/28/2013 Elsevier Interactive Patient Education  2017 [employer] Inc.  

## 2016-10-28 NOTE — Progress Notes (Signed)
Isabel Skinner Jun 19, 1966 [id number]   History:    51 y.o.  for annual gyn exam with no complaints today. Patient has been menopausal since age 29 on no hormone replacement therapy. Patient recently had a right breast biopsy which was benign fibroadipose tissue was reported. She has not had her colonoscopy yet. Patient with no previous history of any abnormal Pap smear. Patient has not had a bone density study as of yet.  Patient's mother and sister and aunt have had history of breast cancer. Patient herself has been tested and does not have the BRCA one BRCA2 gene.   Past medical history,surgical history, family history and social history were all reviewed and documented in the EPIC chart.  Gynecologic History Patient's last menstrual period was 10/03/2015 (approximate). Contraception: post menopausal status Last Pap: 2015. Results were: normal Last mammogram: See above. Results were: See above  Obstetric History OB History  Gravida Para Term Preterm AB Living  '3 2     1 2  '$ SAB TAB Ectopic Multiple Live Births  1       2    # Outcome Date GA Lbr Len/2nd Weight Sex Delivery Anes PTL Lv  3 SAB           2 Para     F Vag-Spont   LIV  1 Para     M Vag-Spont   LIV       ROS: A ROS was performed and pertinent positives and negatives are included in the history.  GENERAL: No fevers or chills. HEENT: No change in vision, no earache, sore throat or sinus congestion. NECK: No pain or stiffness. CARDIOVASCULAR: No chest pain or pressure. No palpitations. PULMONARY: No shortness of breath, cough or wheeze. GASTROINTESTINAL: No abdominal pain, nausea, vomiting or diarrhea, melena or bright red blood per rectum. GENITOURINARY: No urinary frequency, urgency, hesitancy or dysuria. MUSCULOSKELETAL: No joint or muscle pain, no back pain, no recent trauma. DERMATOLOGIC: No rash, no itching, no lesions. ENDOCRINE: No polyuria, polydipsia, no heat or cold intolerance. No recent change in weight.  HEMATOLOGICAL: No anemia or easy bruising or bleeding. NEUROLOGIC: No headache, seizures, numbness, tingling or weakness. PSYCHIATRIC: No depression, no loss of interest in normal activity or change in sleep pattern.     Exam: chaperone present  BP 118/76   Ht '5\' 4"'$  (1.626 m)   Wt 175 lb (79.4 kg)   LMP 10/03/2015 (Approximate)   BMI 30.04 kg/m   Body mass index is 30.04 kg/m.  General appearance : Well developed well nourished female. No acute distress HEENT: Eyes: no retinal hemorrhage or exudates,  Neck supple, trachea midline, no carotid bruits, no thyroidmegaly Lungs: Clear to auscultation, no rhonchi or wheezes, or rib retractions  Heart: Regular rate and rhythm, no murmurs or gallops Breast:Examined in sitting and supine position were symmetrical in appearance, no palpable masses or tenderness,  no skin retraction, no nipple inversion, no nipple discharge, no skin discoloration, no axillary or supraclavicular lymphadenopathy Abdomen: no palpable masses or tenderness, no rebound or guarding Extremities: no edema or skin discoloration or tenderness  Pelvic:  Bartholin, Urethra, Skene Glands: Within normal limits             Vagina: No gross lesions or discharge  Cervix: No gross lesions or discharge  Uterus  anteverted, normal size, shape and consistency, non-tender and mobile  Adnexa  Without masses or tenderness  Anus and perineum  normal   Rectovaginal  normal sphincter tone without  palpated masses or tenderness             Hemoccult patient have colonoscopy this year     Assessment/Plan:  51 y.o. female for annual exam menopausal on no hormone replacement therapy. Patient will return back to the office next week for bone density study in the following fasting blood work: Comprehensive metabolic panel, fasting lipid profile and CBC. We discussed importance of calcium vitamin D and weightbearing exercises for osteoporosis prevention. I provided her with the name of  community gastroenterologist for her to schedule her overdue screening colonoscopy.   Terrance Mass MD, 3:31 PM 10/28/2016

## 2016-11-01 LAB — PAP IG W/ RFLX HPV ASCU

## 2016-11-16 ENCOUNTER — Encounter: Payer: Self-pay | Admitting: Gynecology

## 2017-03-15 ENCOUNTER — Encounter: Payer: Self-pay | Admitting: Internal Medicine

## 2017-03-15 ENCOUNTER — Ambulatory Visit (INDEPENDENT_AMBULATORY_CARE_PROVIDER_SITE_OTHER): Payer: BC Managed Care – PPO | Admitting: Internal Medicine

## 2017-03-15 ENCOUNTER — Other Ambulatory Visit (INDEPENDENT_AMBULATORY_CARE_PROVIDER_SITE_OTHER): Payer: BC Managed Care – PPO

## 2017-03-15 ENCOUNTER — Ambulatory Visit (INDEPENDENT_AMBULATORY_CARE_PROVIDER_SITE_OTHER)
Admission: RE | Admit: 2017-03-15 | Discharge: 2017-03-15 | Disposition: A | Discharge status: Home / Self Care | Payer: BC Managed Care – PPO | Source: Ambulatory Visit | Attending: Internal Medicine | Admitting: Internal Medicine

## 2017-03-15 DIAGNOSIS — R0789 Other chest pain: Secondary | ICD-10-CM

## 2017-03-15 DIAGNOSIS — J301 Allergic rhinitis due to pollen: Secondary | ICD-10-CM | POA: Diagnosis not present

## 2017-03-15 LAB — CBC WITH DIFFERENTIAL/PLATELET
Basophils Absolute: 0 10*3/uL (ref 0.0–0.1)
Basophils Relative: 0.6 % (ref 0.0–3.0)
EOS ABS: 0.3 10*3/uL (ref 0.0–0.7)
EOS PCT: 5 % (ref 0.0–5.0)
HEMATOCRIT: 39 % (ref 36.0–46.0)
HEMOGLOBIN: 12.8 g/dL (ref 12.0–15.0)
LYMPHS PCT: 33.5 % (ref 12.0–46.0)
Lymphs Abs: 2.2 10*3/uL (ref 0.7–4.0)
MCHC: 32.8 g/dL (ref 30.0–36.0)
MCV: 86.1 fl (ref 78.0–100.0)
MONO ABS: 0.6 10*3/uL (ref 0.1–1.0)
Monocytes Relative: 9.4 % (ref 3.0–12.0)
Neutro Abs: 3.4 10*3/uL (ref 1.4–7.7)
Neutrophils Relative %: 51.5 % (ref 43.0–77.0)
Platelets: 329 10*3/uL (ref 150.0–400.0)
RBC: 4.53 Mil/uL (ref 3.87–5.11)
RDW: 13.9 % (ref 11.5–15.5)
WBC: 6.6 10*3/uL (ref 4.0–10.5)

## 2017-03-15 LAB — D-DIMER, QUANTITATIVE (NOT AT ARMC): D DIMER QUANT: 0.22 ug{FEU}/mL (ref <0.50)

## 2017-03-15 LAB — URINALYSIS
BILIRUBIN URINE: NEGATIVE
HGB URINE DIPSTICK: NEGATIVE
Ketones, ur: NEGATIVE
LEUKOCYTES UA: NEGATIVE
NITRITE: NEGATIVE
Specific Gravity, Urine: 1.015 (ref 1.000–1.030)
Total Protein, Urine: NEGATIVE
Urine Glucose: NEGATIVE
Urobilinogen, UA: 0.2 (ref 0.0–1.0)
pH: 6 (ref 5.0–8.0)

## 2017-03-15 LAB — BASIC METABOLIC PANEL
BUN: 12 mg/dL (ref 6–23)
CALCIUM: 9.6 mg/dL (ref 8.4–10.5)
CO2: 25 mEq/L (ref 19–32)
Chloride: 106 mEq/L (ref 96–112)
Creatinine, Ser: 0.77 mg/dL (ref 0.40–1.20)
GFR: 101.04 mL/min (ref >60.00)
GLUCOSE: 92 mg/dL (ref 70–99)
Potassium: 4.4 mEq/L (ref 3.5–5.1)
SODIUM: 140 meq/L (ref 135–145)

## 2017-03-15 LAB — HEPATIC FUNCTION PANEL
ALK PHOS: 66 U/L (ref 39–117)
ALT: 11 U/L (ref 0–35)
AST: 17 U/L (ref 0–37)
Albumin: 4.4 g/dL (ref 3.5–5.2)
BILIRUBIN DIRECT: 0.1 mg/dL (ref 0.0–0.3)
BILIRUBIN TOTAL: 0.4 mg/dL (ref 0.2–1.2)
TOTAL PROTEIN: 7.4 g/dL (ref 6.0–8.3)

## 2017-03-15 LAB — SEDIMENTATION RATE: SED RATE: 30 mm/h (ref 0–30)

## 2017-03-15 MED ORDER — CETIRIZINE HCL 10 MG PO TABS: 10.0000 mg | ORAL_TABLET | Freq: Every day | ORAL | 3 refills | Status: DC

## 2017-03-15 MED ORDER — MONTELUKAST SODIUM 10 MG PO TABS: 10.0000 mg | ORAL_TABLET | Freq: Every day | ORAL | 3 refills | Status: DC

## 2017-03-15 MED ORDER — TRAMADOL HCL 50 MG PO TABS: 50.0000 mg | ORAL_TABLET | Freq: Four times a day (QID) | ORAL | 0 refills | Status: DC | PRN

## 2017-03-15 MED ORDER — FLUTICASONE PROPIONATE 50 MCG/ACT NA SUSP: 2.0000 | Freq: Every day | NASAL | 3 refills | Status: DC

## 2017-03-15 MED ORDER — IBUPROFEN 600 MG PO TABS: ORAL_TABLET | ORAL | 0 refills | Status: DC

## 2017-03-15 NOTE — Patient Instructions (Signed)
Take Xarelto 1 tab twice a day if I ask you to please Go to ER if worse

## 2017-03-15 NOTE — Progress Notes (Signed)
Subjective:  Patient ID: Isabel Skinner, female    DOB: 05/27/1966  Age: 51 y.o. MRN: [id number]  CC: No chief complaint on file.   HPI Isabel Skinner presents for R CP starting last night worse w/movement, breathing deep No SOB, cramps, swelling C/o cough - mild No recent trips   Outpatient Medications Prior to Visit  Medication Sig Dispense Refill  . cetirizine (ZYRTEC) 10 MG tablet Take 1 tablet (10 mg total) by mouth daily. 30 tablet 11  . fluticasone (FLONASE) 50 MCG/ACT nasal spray Place 2 sprays into both nostrils daily. 16 g 6  . montelukast (SINGULAIR) 10 MG tablet Take 1 tablet (10 mg total) by mouth daily. 90 tablet 3   No facility-administered medications prior to visit.     ROS Review of Systems  Constitutional: Negative for activity change, appetite change, chills, fatigue, fever and unexpected weight change.  HENT: Positive for rhinorrhea. Negative for congestion, mouth sores and sinus pressure.   Eyes: Negative for visual disturbance.  Respiratory: Positive for cough. Negative for chest tightness and shortness of breath.   Cardiovascular: Positive for chest pain. Negative for palpitations and leg swelling.  Gastrointestinal: Negative for abdominal pain and nausea.  Genitourinary: Negative for difficulty urinating, frequency and vaginal pain.  Musculoskeletal: Negative for back pain and gait problem.  Skin: Negative for pallor and rash.  Neurological: Negative for dizziness, tremors, syncope, weakness, numbness and headaches.  Psychiatric/Behavioral: Negative for confusion and sleep disturbance.    Objective:  BP 122/80 (BP Location: Left Arm, Patient Position: Sitting, Cuff Size: Large)   Pulse 86   Temp 98.4 F (36.9 C) (Oral)   Ht 5\' 4"  (1.626 m)   Wt 174 lb (78.9 kg)   LMP 10/03/2015 (Approximate)   SpO2 100%   BMI 29.87 kg/m   BP Readings from Last 3 Encounters:  03/15/17 122/80  10/28/16 118/76  10/14/16 128/86    Wt Readings from Last 3  Encounters:  03/15/17 174 lb (78.9 kg)  10/28/16 175 lb (79.4 kg)  10/14/16 172 lb (78 kg)    Physical Exam  Constitutional: She appears well-developed. No distress.  HENT:  Head: Normocephalic.  Right Ear: External ear normal.  Left Ear: External ear normal.  Nose: Nose normal.  Mouth/Throat: Oropharynx is clear and moist.  Eyes: Pupils are equal, round, and reactive to light. Conjunctivae are normal. Right eye exhibits no discharge. Left eye exhibits no discharge.  Neck: Normal range of motion. Neck supple. No JVD present. No tracheal deviation present. No thyromegaly present.  Cardiovascular: Normal rate, regular rhythm and normal heart sounds.   Pulmonary/Chest: No stridor. No respiratory distress. She has no wheezes.  Abdominal: Soft. Bowel sounds are normal. She exhibits no distension and no mass. There is no tenderness. There is no rebound and no guarding.  Musculoskeletal: She exhibits no edema or tenderness.  Lymphadenopathy:    She has no cervical adenopathy.  Neurological: She displays normal reflexes. No cranial nerve deficit. She exhibits normal muscle tone. Coordination normal.  Skin: No rash noted. No erythema.  Psychiatric: She has a normal mood and affect. Her behavior is normal. Judgment and thought content normal.    Lab Results  Component Value Date   WBC 8.3 05/25/2015   HGB 12.7 05/25/2015   HCT 39.0 05/25/2015   PLT 308.0 05/25/2015   GLUCOSE 78 05/25/2015   CHOL 219 (H) 05/25/2015   TRIG 73.0 05/25/2015   HDL 75.90 05/25/2015   LDLCALC 129 (H) 05/25/2015  ALT 12 05/25/2015   AST 18 05/25/2015   NA 138 05/25/2015   K 3.8 05/25/2015   CL 103 05/25/2015   CREATININE 0.67 05/25/2015   BUN 12 05/25/2015   CO2 27 05/25/2015   TSH 1.65 05/25/2015   HGBA1C 5.8 05/25/2015    No results found.  Assessment & Plan:   There are no diagnoses linked to this encounter. I am having Ms. Hodkinson maintain her montelukast, cetirizine, and fluticasone.  No  orders of the defined types were placed in this encounter.    Follow-up: No Follow-up on file.  Walker Kehr, MD

## 2017-03-15 NOTE — Assessment & Plan Note (Addendum)
R ?etiology: r/o pleurisy, MSK, kidney related, zoster less likely PE - discussed Labs, CXR To ER if worse Xarelto samples if pos D dimer Off work Tramadol, Ibuprofen

## 2017-03-15 NOTE — Assessment & Plan Note (Signed)
meds renewed

## 2017-03-16 ENCOUNTER — Telehealth: Payer: Self-pay | Admitting: Internal Medicine

## 2017-03-16 NOTE — Telephone Encounter (Signed)
noted 

## 2017-03-16 NOTE — Telephone Encounter (Signed)
Patient called about xray results. She has been informed of the notes, she understood.

## 2017-10-30 ENCOUNTER — Ambulatory Visit (INDEPENDENT_AMBULATORY_CARE_PROVIDER_SITE_OTHER): Payer: BC Managed Care – PPO | Admitting: Internal Medicine

## 2017-10-30 ENCOUNTER — Encounter: Payer: Self-pay | Admitting: Internal Medicine

## 2017-10-30 VITALS — BP 126/78 | HR 75 | Temp 97.8°F | Ht 64.0 in | Wt 170.0 lb

## 2017-10-30 DIAGNOSIS — E7800 Pure hypercholesterolemia, unspecified: Secondary | ICD-10-CM

## 2017-10-30 DIAGNOSIS — E559 Vitamin D deficiency, unspecified: Secondary | ICD-10-CM

## 2017-10-30 DIAGNOSIS — Z0000 Encounter for general adult medical examination without abnormal findings: Secondary | ICD-10-CM

## 2017-10-30 DIAGNOSIS — Z1211 Encounter for screening for malignant neoplasm of colon: Secondary | ICD-10-CM | POA: Diagnosis not present

## 2017-10-30 MED ORDER — VITAMIN D3 50 MCG (2000 UT) PO CAPS: 2000.0000 [IU] | ORAL_CAPSULE | Freq: Every day | ORAL | 3 refills | Status: AC

## 2017-10-30 NOTE — Assessment & Plan Note (Addendum)
We discussed age appropriate health related issues, including available/recomended screening tests and vaccinations. We discussed a need for adhering to healthy diet and exercise. Labs were ordered to be later reviewed . All questions were answered. GYN, Ophth -  q 2 years Menopausal - hot flashes Cologuard vs colonoscopy Shingrix Labs

## 2017-10-30 NOTE — Assessment & Plan Note (Signed)
Labs

## 2017-10-30 NOTE — Progress Notes (Signed)
Subjective:  Patient ID: Isabel Skinner, female    DOB: January 05, 1966  Age: 52 y.o. MRN: [id number]  CC: No chief complaint on file.   HPI Isabel Skinner presents for a well exam  Outpatient Medications Prior to Visit  Medication Sig Dispense Refill  . cetirizine (ZYRTEC) 10 MG tablet Take 1 tablet (10 mg total) by mouth daily. 90 tablet 3  . fluticasone (FLONASE) 50 MCG/ACT nasal spray Place 2 sprays into both nostrils daily. 48 g 3  . ibuprofen (ADVIL,MOTRIN) 600 MG tablet Take twice a day x 1 week, then prn pain 60 tablet 0  . montelukast (SINGULAIR) 10 MG tablet Take 1 tablet (10 mg total) by mouth daily. 90 tablet 3  . traMADol (ULTRAM) 50 MG tablet Take 1-2 tablets (50-100 mg total) by mouth every 6 (six) hours as needed. 60 tablet 0   No facility-administered medications prior to visit.     ROS Review of Systems  Constitutional: Negative for activity change, appetite change, chills, fatigue and unexpected weight change.  HENT: Negative for congestion, mouth sores and sinus pressure.   Eyes: Negative for visual disturbance.  Respiratory: Negative for cough and chest tightness.   Gastrointestinal: Negative for abdominal pain and nausea.  Genitourinary: Negative for difficulty urinating, frequency and vaginal pain.  Musculoskeletal: Negative for back pain and gait problem.  Skin: Negative for pallor and rash.  Neurological: Negative for dizziness, tremors, weakness, numbness and headaches.  Psychiatric/Behavioral: Negative for confusion, sleep disturbance and suicidal ideas.    Objective:  BP 126/78 (BP Location: Left Arm, Patient Position: Sitting, Cuff Size: Large)   Pulse 75   Temp 97.8 F (36.6 C) (Oral)   Ht 5\' 4"  (1.626 m)   Wt 170 lb (77.1 kg)   LMP 10/03/2015 (Approximate)   SpO2 98%   BMI 29.18 kg/m   BP Readings from Last 3 Encounters:  10/30/17 126/78  03/15/17 122/80  10/28/16 118/76    Wt Readings from Last 3 Encounters:  10/30/17 170 lb (77.1 kg)    03/15/17 174 lb (78.9 kg)  10/28/16 175 lb (79.4 kg)    Physical Exam  Constitutional: She appears well-developed. No distress.  HENT:  Head: Normocephalic.  Right Ear: External ear normal.  Left Ear: External ear normal.  Nose: Nose normal.  Mouth/Throat: Oropharynx is clear and moist.  Eyes: Pupils are equal, round, and reactive to light. Conjunctivae are normal. Right eye exhibits no discharge. Left eye exhibits no discharge.  Neck: Normal range of motion. Neck supple. No JVD present. No tracheal deviation present. No thyromegaly present.  Cardiovascular: Normal rate, regular rhythm and normal heart sounds.  Pulmonary/Chest: No stridor. No respiratory distress. She has no wheezes.  Abdominal: Soft. Bowel sounds are normal. She exhibits no distension and no mass. There is no tenderness. There is no rebound and no guarding.  Musculoskeletal: She exhibits no edema or tenderness.  Lymphadenopathy:    She has no cervical adenopathy.  Neurological: She displays normal reflexes. No cranial nerve deficit. She exhibits normal muscle tone. Coordination normal.  Skin: No rash noted. No erythema.  Psychiatric: She has a normal mood and affect. Her behavior is normal. Judgment and thought content normal.    Lab Results  Component Value Date   WBC 6.6 03/15/2017   HGB 12.8 03/15/2017   HCT 39.0 03/15/2017   PLT 329.0 03/15/2017   GLUCOSE 92 03/15/2017   CHOL 219 (H) 05/25/2015   TRIG 73.0 05/25/2015   HDL 75.90 05/25/2015  LDLCALC 129 (H) 05/25/2015   ALT 11 03/15/2017   AST 17 03/15/2017   NA 140 03/15/2017   K 4.4 03/15/2017   CL 106 03/15/2017   CREATININE 0.77 03/15/2017   BUN 12 03/15/2017   CO2 25 03/15/2017   TSH 1.65 05/25/2015   HGBA1C 5.8 05/25/2015    Dg Chest 2 View  Result Date: 03/15/2017 CLINICAL DATA:  Posterior right-sided chest pain. Sharp pain with deep breath. EXAM: CHEST  2 VIEW COMPARISON:  None. FINDINGS: The heart size and mediastinal contours are  within normal limits. Both lungs are clear. The visualized skeletal structures are unremarkable. IMPRESSION: Negative two view chest x-ray Electronically Signed   By: San Morelle M.D.   On: 03/15/2017 13:39    Assessment & Plan:   There are no diagnoses linked to this encounter. I am having Isabel Skinner maintain her ibuprofen, traMADol, montelukast, cetirizine, and fluticasone.  No orders of the defined types were placed in this encounter.    Follow-up: No follow-ups on file.  Walker Kehr, MD

## 2017-10-30 NOTE — Patient Instructions (Signed)
Shingrix vaccine

## 2017-10-30 NOTE — Assessment & Plan Note (Signed)
Vit d 

## 2017-12-05 ENCOUNTER — Ambulatory Visit: Payer: BC Managed Care – PPO

## 2017-12-15 ENCOUNTER — Other Ambulatory Visit (INDEPENDENT_AMBULATORY_CARE_PROVIDER_SITE_OTHER): Payer: BC Managed Care – PPO

## 2017-12-15 ENCOUNTER — Ambulatory Visit (INDEPENDENT_AMBULATORY_CARE_PROVIDER_SITE_OTHER): Payer: BC Managed Care – PPO

## 2017-12-15 DIAGNOSIS — Z0000 Encounter for general adult medical examination without abnormal findings: Secondary | ICD-10-CM | POA: Diagnosis not present

## 2017-12-15 DIAGNOSIS — Z23 Encounter for immunization: Secondary | ICD-10-CM

## 2017-12-15 DIAGNOSIS — E7800 Pure hypercholesterolemia, unspecified: Secondary | ICD-10-CM

## 2017-12-15 LAB — URINALYSIS
Bilirubin Urine: NEGATIVE
HGB URINE DIPSTICK: NEGATIVE
KETONES UR: NEGATIVE
LEUKOCYTES UA: NEGATIVE
NITRITE: NEGATIVE
Specific Gravity, Urine: 1.01 (ref 1.000–1.030)
Total Protein, Urine: NEGATIVE
UROBILINOGEN UA: 0.2 (ref 0.0–1.0)
Urine Glucose: NEGATIVE
pH: 6 (ref 5.0–8.0)

## 2017-12-15 LAB — HEPATIC FUNCTION PANEL
ALK PHOS: 67 U/L (ref 39–117)
ALT: 11 U/L (ref 0–35)
AST: 14 U/L (ref 0–37)
Albumin: 4.3 g/dL (ref 3.5–5.2)
BILIRUBIN TOTAL: 0.2 mg/dL (ref 0.2–1.2)
Bilirubin, Direct: 0.1 mg/dL (ref 0.0–0.3)
Total Protein: 7.4 g/dL (ref 6.0–8.3)

## 2017-12-15 LAB — CBC WITH DIFFERENTIAL/PLATELET
BASOS ABS: 0 10*3/uL (ref 0.0–0.1)
Basophils Relative: 0.5 % (ref 0.0–3.0)
EOS ABS: 0.2 10*3/uL (ref 0.0–0.7)
Eosinophils Relative: 2.9 % (ref 0.0–5.0)
HCT: 39.6 % (ref 36.0–46.0)
Hemoglobin: 13.1 g/dL (ref 12.0–15.0)
LYMPHS ABS: 2.1 10*3/uL (ref 0.7–4.0)
Lymphocytes Relative: 31.4 % (ref 12.0–46.0)
MCHC: 33 g/dL (ref 30.0–36.0)
MCV: 85.4 fl (ref 78.0–100.0)
MONO ABS: 0.5 10*3/uL (ref 0.1–1.0)
MONOS PCT: 7.3 % (ref 3.0–12.0)
NEUTROS ABS: 3.8 10*3/uL (ref 1.4–7.7)
Neutrophils Relative %: 57.9 % (ref 43.0–77.0)
PLATELETS: 393 10*3/uL (ref 150.0–400.0)
RBC: 4.64 Mil/uL (ref 3.87–5.11)
RDW: 13.8 % (ref 11.5–15.5)
WBC: 6.6 10*3/uL (ref 4.0–10.5)

## 2017-12-15 LAB — BASIC METABOLIC PANEL
BUN: 11 mg/dL (ref 6–23)
CHLORIDE: 106 meq/L (ref 96–112)
CO2: 26 meq/L (ref 19–32)
Calcium: 9.5 mg/dL (ref 8.4–10.5)
Creatinine, Ser: 0.63 mg/dL (ref 0.40–1.20)
GFR: 127 mL/min (ref >60.00)
GLUCOSE: 94 mg/dL (ref 70–99)
Potassium: 4.3 mEq/L (ref 3.5–5.1)
SODIUM: 141 meq/L (ref 135–145)

## 2017-12-15 LAB — TSH: TSH: 1.03 u[IU]/mL (ref 0.35–4.50)

## 2017-12-15 LAB — LIPID PANEL
CHOL/HDL RATIO: 3
Cholesterol: 174 mg/dL (ref 0–200)
HDL: 50.8 mg/dL (ref >39.00)
LDL CALC: 113 mg/dL — AB (ref 0–99)
NONHDL: 122.82
Triglycerides: 49 mg/dL (ref 0.0–149.0)
VLDL: 9.8 mg/dL (ref 0.0–40.0)

## 2018-02-16 ENCOUNTER — Ambulatory Visit: Payer: BC Managed Care – PPO

## 2018-02-23 ENCOUNTER — Ambulatory Visit (INDEPENDENT_AMBULATORY_CARE_PROVIDER_SITE_OTHER): Payer: BC Managed Care – PPO

## 2018-02-23 DIAGNOSIS — Z299 Encounter for prophylactic measures, unspecified: Secondary | ICD-10-CM

## 2018-08-10 ENCOUNTER — Telehealth: Payer: Self-pay

## 2018-08-10 DIAGNOSIS — N6459 Other signs and symptoms in breast: Secondary | ICD-10-CM

## 2018-08-10 NOTE — Telephone Encounter (Signed)
Imaging ordered  Copied from [place] [id number]. Topic: General - Other >> Aug 03, 2018  5:05 PM Percell Belt A wrote: Reason for CRM: Pt called in and would like to see if Dr Camila Li could be a order in for a diagnostic Mammogram , she stated she has a knot in her right breast .  She wanted to see if Dr Camila Li could pt this in without her coming in to see PCP 1st?    Best number [phone]

## 2018-08-21 ENCOUNTER — Encounter: Payer: Self-pay | Admitting: Internal Medicine

## 2018-08-21 LAB — HM MAMMOGRAPHY

## 2018-08-30 ENCOUNTER — Encounter: Payer: Self-pay | Admitting: Internal Medicine

## 2018-08-30 NOTE — Progress Notes (Signed)
Outside notes received. Information abstracted. Notes sent to scan.  

## 2018-09-03 ENCOUNTER — Ambulatory Visit: Payer: BC Managed Care – PPO | Admitting: Family

## 2018-09-03 ENCOUNTER — Encounter: Payer: Self-pay | Admitting: Family

## 2018-09-03 VITALS — BP 136/80 | HR 120 | Temp 101.7°F | Ht 64.0 in | Wt 165.0 lb

## 2018-09-03 DIAGNOSIS — J101 Influenza due to other identified influenza virus with other respiratory manifestations: Secondary | ICD-10-CM

## 2018-09-03 DIAGNOSIS — Z114 Encounter for screening for human immunodeficiency virus [HIV]: Secondary | ICD-10-CM | POA: Diagnosis not present

## 2018-09-03 DIAGNOSIS — R6889 Other general symptoms and signs: Secondary | ICD-10-CM

## 2018-09-03 LAB — POC INFLUENZA A&B (BINAX/QUICKVUE)
INFLUENZA A, POC: POSITIVE — AB
Influenza B, POC: NEGATIVE

## 2018-09-03 MED ORDER — OSELTAMIVIR PHOSPHATE 75 MG PO CAPS: 75.0000 mg | ORAL_CAPSULE | Freq: Two times a day (BID) | ORAL | 0 refills | Status: DC

## 2018-09-03 MED ORDER — ALBUTEROL SULFATE HFA 108 (90 BASE) MCG/ACT IN AERS: 2.0000 | INHALATION_SPRAY | Freq: Four times a day (QID) | RESPIRATORY_TRACT | 0 refills | Status: DC | PRN

## 2018-09-03 NOTE — Patient Instructions (Signed)

## 2018-09-03 NOTE — Progress Notes (Signed)
Isabel Skinner is a 52 y.o. female with the following history as recorded in EpicCare:  Patient Active Problem List   Diagnosis Date Noted  . Chest pain, atypical 03/15/2017  . Sebaceous cyst of labia 10/14/2016  . Well adult exam 05/25/2015  . Vitamin D deficiency 05/25/2015  . Breast mass, right 02/21/2014  . Hot flushes, perimenopausal 02/21/2014  . Mass of breast, left 08/09/2012  . Mass of breast, right 08/09/2012  . Fibroid uterus 05/03/2012  . Hypercholesterolemia 05/03/2012  . Family history of breast cancer in female 04/25/2012  . Family history of diabetes mellitus 04/25/2012  . URI 09/26/2007  . Allergic rhinitis 09/26/2007  . ELEVATED BLOOD PRESSURE WITHOUT DIAGNOSIS OF HYPERTENSION 09/26/2007    Current Outpatient Medications  Medication Sig Dispense Refill  . cetirizine (ZYRTEC) 10 MG tablet Take 1 tablet (10 mg total) by mouth daily. 90 tablet 3  . Cholecalciferol (VITAMIN D3) 2000 units capsule Take 1 capsule (2,000 Units total) by mouth daily. 100 capsule 3  . fluticasone (FLONASE) 50 MCG/ACT nasal spray Place 2 sprays into both nostrils daily. 48 g 3  . ibuprofen (ADVIL,MOTRIN) 600 MG tablet Take twice a day x 1 week, then prn pain 60 tablet 0  . montelukast (SINGULAIR) 10 MG tablet Take 1 tablet (10 mg total) by mouth daily. 90 tablet 3  . albuterol (PROVENTIL HFA;VENTOLIN HFA) 108 (90 Base) MCG/ACT inhaler Inhale 2 puffs into the lungs every 6 (six) hours as needed for wheezing or shortness of breath. 1 Inhaler 0  . oseltamivir (TAMIFLU) 75 MG capsule Take 1 capsule (75 mg total) by mouth 2 (two) times daily. 10 capsule 0   No current facility-administered medications for this visit.     Allergies: Patient has no known allergies.  Past Medical History:  Diagnosis Date  . Allergy   . BRCA1 negative   . BRCA2 negative   . Fibroadenoma    RIGHT AXILLA    Past Surgical History:  Procedure Laterality Date  . BREAST SURGERY  09/2009   RIGHT BREAST LUMP-  LUMPECTOMY BENIGN.  . ENDOMETRIAL ABLATION  03/31/2006   NOVASURE  . LIPOMA EXCISION  2002   RIGHT AND LEFT UNDER ARMS    Family History  Problem Relation Age of Onset  . Breast cancer Mother 39  . Diabetes Father        INSULIN  . Hypertension Father   . Cancer Father 39       breast  . Breast cancer Sister 39  . ALS Brother     Social History   Tobacco Use  . Smoking status: Never Smoker  . Smokeless tobacco: Never Used  Substance Use Topics  . Alcohol use: Yes    Alcohol/week: 0.0 standard drinks    Comment: SOCIAL    Subjective:  2 day history of flu-like symptoms; + headache, chills, nausea; did not take a flu shot this year; +nasal congestion; works as a principal at an elementary school; no chest pain, no shortness of breath;  Also requesting to have HIV testing updated if possible;    Objective:  Vitals:   09/03/18 1022  BP: 136/80  Pulse: (!) 120  Temp: (!) 101.7 F (38.7 C)  TempSrc: Oral  SpO2: 98%  Weight: 165 lb (74.8 kg)  Height: 5' 4" (1.626 m)    General: Well developed, well nourished, in no acute distress  Skin : Warm and dry.  Head: Normocephalic and atraumatic  Eyes: Sclera and conjunctiva clear; pupils round   and reactive to light; extraocular movements intact  Ears: External normal; canals clear; tympanic membranes normal  Oropharynx: Pink, supple. No suspicious lesions  Neck: Supple without thyromegaly, adenopathy  Lungs: Respirations unlabored; clear to auscultation bilaterally without wheeze, rales, rhonchi  CVS exam: normal rate and regular rhythm.  Neurologic: Alert and oriented; speech intact; face symmetrical; moves all extremities well; CNII-XII intact without focal deficit   Assessment:  1. Flu-like symptoms   2. Influenza A   3. Encounter for screening for HIV     Plan:  1. & 2. Rapid flu is positive; Rx for Tamiflu 75 mg bid x 5 days, albuterol inhaler; continue OTC cough/ cold medication; increase fluids, rest and follow-up  worse, no better; work note given for this week as requested; 3. Order for HIV updated- she will return at a later date when healthy to get lab done.   No follow-ups on file.  Orders Placed This Encounter  Procedures  . HIV Antibody (routine testing w rflx)    Standing Status:   Future    Standing Expiration Date:   09/03/2019  . POC Influenza A&B (Binax test)    Requested Prescriptions   Signed Prescriptions Disp Refills  . oseltamivir (TAMIFLU) 75 MG capsule 10 capsule 0    Sig: Take 1 capsule (75 mg total) by mouth 2 (two) times daily.  . albuterol (PROVENTIL HFA;VENTOLIN HFA) 108 (90 Base) MCG/ACT inhaler 1 Inhaler 0    Sig: Inhale 2 puffs into the lungs every 6 (six) hours as needed for wheezing or shortness of breath.     

## 2018-12-25 ENCOUNTER — Encounter: Payer: Self-pay | Admitting: Gastroenterology

## 2018-12-27 ENCOUNTER — Telehealth: Payer: Self-pay | Admitting: *Deleted

## 2018-12-27 ENCOUNTER — Other Ambulatory Visit: Payer: BC Managed Care – PPO

## 2018-12-27 ENCOUNTER — Ambulatory Visit: Payer: Self-pay

## 2018-12-27 DIAGNOSIS — Z20822 Contact with and (suspected) exposure to covid-19: Secondary | ICD-10-CM

## 2018-12-27 NOTE — Telephone Encounter (Signed)
Message sent to [facility] to schedule testing

## 2018-12-27 NOTE — Telephone Encounter (Signed)
Patient called and says her employee's son is being tested for covid due to symptoms and being exposed to someone who tested positive, son went to the beach. 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 She says she's not having any symptoms, but is wondering if she needs to be tested. I called the office and spoke to [place], [facility] who says to let her know that Dr. Alain Marion will review the note and if he wants her tested, he will send a request; if not, she will hear from someone in the office with his recommendation. I advised the patient of the above and that if she is referred for testing, a nurse will call her to schedule, she verbalized understanding.  Answer Assessment - Initial Assessment Questions 1. CLOSE CONTACT: "Who is the person with the confirmed or suspected COVID-19 infection that you were exposed to?"     Employee's son is being tested 2. PLACE of CONTACT: "Where were you when you were exposed to COVID-19?" (e.g., home, school, medical waiting room; which city?)     [place], [place] 3. TYPE of CONTACT: "How much contact was there?" (e.g., sitting next to, live in same house, work in same office, same building)     We were in the same office 4. DURATION of CONTACT: "How long were you in contact with the COVID-19 patient?" (e.g., a few seconds, passed by person, a few minutes, live with the patient)     Probably 1.5 hours 5. DATE of CONTACT: "When did you have contact with a COVID-19 patient?" (e.g., how many days ago)     Yesterday 6. TRAVEL: "Have you traveled out of the country recently?" If so, "When and where?"     * Also ask about out-of-state travel, since the CDC has identified some high-risk cities for community spread in the [place].     * Note: Travel becomes less relevant if there is widespread community transmission where the patient lives.     No 7. COMMUNITY SPREAD: "Are there lots of cases of COVID-19 (community  spread) where you live?" (See public health department website, if unsure)       No 8. SYMPTOMS: "Do you have any symptoms?" (e.g., fever, cough, breathing difficulty)     No 9. PREGNANCY OR POSTPARTUM: "Is there any chance you are pregnant?" "When was your last menstrual period?" "Did you deliver in the last 2 weeks?"     No 10. HIGH RISK: "Do you have any heart or lung problems? Do you have a weak immune system?" (e.g., CHF, COPD, asthma, HIV positive, chemotherapy, renal failure, diabetes mellitus, sickle cell anemia)       No  Protocols used: CORONAVIRUS (COVID-19) EXPOSURE-A-AH

## 2018-12-27 NOTE — Telephone Encounter (Signed)
-----   Message from [place], [place] sent at 12/27/2018  2:39 PM EDT ----- Regarding: Covid test Covid testing per Dr. Alain Marion for exposure.

## 2018-12-27 NOTE — Telephone Encounter (Signed)
Spoke with patient- scheduled her for appointment at [facility] today at 3:30 pm.  Testing protocol discussed.

## 2018-12-31 ENCOUNTER — Encounter: Payer: BC Managed Care – PPO | Admitting: Women's Health

## 2018-12-31 LAB — NOVEL CORONAVIRUS, NAA: SARS-CoV-2, NAA: NOT DETECTED

## 2019-01-02 ENCOUNTER — Ambulatory Visit: Payer: BC Managed Care – PPO

## 2019-01-02 ENCOUNTER — Other Ambulatory Visit: Payer: Self-pay

## 2019-01-02 VITALS — Ht 64.0 in | Wt 164.0 lb

## 2019-01-02 DIAGNOSIS — Z1211 Encounter for screening for malignant neoplasm of colon: Secondary | ICD-10-CM

## 2019-01-02 MED ORDER — SUPREP BOWEL PREP KIT 17.5-3.13-1.6 GM/177ML PO SOLN: 1.0000 | Freq: Once | ORAL | 0 refills | Status: AC

## 2019-01-02 NOTE — Progress Notes (Signed)
Per pt, no allergies to soy or egg products.Pt not taking any weight loss meds or using  O2 at home.  Pt denies sedation problem.  Pt refused emmi video.  The PV was done over the phone due to COVID-19. Verified pt;s address and insurance, Reviewed medical hx and prep instructions with the pt and will mail paperwork to the pt. Informed pt to call with any questions or changes prior to the procedure. Pt understood.

## 2019-01-09 ENCOUNTER — Encounter: Payer: BC Managed Care – PPO | Admitting: Internal Medicine

## 2019-01-09 ENCOUNTER — Encounter: Payer: Self-pay | Admitting: Gastroenterology

## 2019-01-15 ENCOUNTER — Telehealth: Payer: Self-pay | Admitting: Gastroenterology

## 2019-01-15 NOTE — Telephone Encounter (Signed)
Spoke with patient regarding the Covid-19 screening questions. Covid-19 Screening Questions:  Do you now or have you had a fever in the last 14 days? no  Do you have any respiratory symptoms of shortness of breath or cough now or in the last 14 days? no  Do you have any family members or close contacts with diagnosed or  suspected Covid-19 in the past 14 days? no  Have you been tested for Covid-19 and found to be positive?  Yes, Negative  Pt made aware of that care partner may wait in the car or come up to the lobby during the procedure but will need to provide their own mask.

## 2019-01-16 ENCOUNTER — Other Ambulatory Visit: Payer: Self-pay

## 2019-01-16 ENCOUNTER — Encounter: Payer: Self-pay | Admitting: Gastroenterology

## 2019-01-16 ENCOUNTER — Ambulatory Visit (AMBULATORY_SURGERY_CENTER): Payer: BC Managed Care – PPO | Admitting: Gastroenterology

## 2019-01-16 VITALS — BP 126/75 | HR 70 | Temp 98.3°F | Resp 14 | Ht 64.0 in | Wt 164.0 lb

## 2019-01-16 DIAGNOSIS — Z1211 Encounter for screening for malignant neoplasm of colon: Secondary | ICD-10-CM

## 2019-01-16 MED ORDER — SODIUM CHLORIDE 0.9 % IV SOLN: 500.0000 mL | Freq: Once | INTRAVENOUS | Status: DC

## 2019-01-16 NOTE — Progress Notes (Signed)
A/ox3, pleased with MAC, report to RN 

## 2019-01-16 NOTE — Patient Instructions (Signed)
Please read handouts provided. Continue present medications.     YOU HAD AN ENDOSCOPIC PROCEDURE TODAY AT THE [facility]:   Refer to the procedure report that was given to you for any specific questions about what was found during the examination.  If the procedure report does not answer your questions, please call your gastroenterologist to clarify.  If you requested that your care partner not be given the details of your procedure findings, then the procedure report has been included in a sealed envelope for you to review at your convenience later.  YOU SHOULD EXPECT: Some feelings of bloating in the abdomen. Passage of more gas than usual.  Walking can help get rid of the air that was put into your GI tract during the procedure and reduce the bloating. If you had a lower endoscopy (such as a colonoscopy or flexible sigmoidoscopy) you may notice spotting of blood in your stool or on the toilet paper. If you underwent a bowel prep for your procedure, you may not have a normal bowel movement for a few days.  Please Note:  You might notice some irritation and congestion in your nose or some drainage.  This is from the oxygen used during your procedure.  There is no need for concern and it should clear up in a day or so.  SYMPTOMS TO REPORT IMMEDIATELY:   Following lower endoscopy (colonoscopy or flexible sigmoidoscopy):  Excessive amounts of blood in the stool  Significant tenderness or worsening of abdominal pains  Swelling of the abdomen that is new, acute  Fever of 100F or higher   For urgent or emergent issues, a gastroenterologist can be reached at any hour by calling [phone].   DIET:  We do recommend a small meal at first, but then you may proceed to your regular diet.  Drink plenty of fluids but you should avoid alcoholic beverages for 24 hours.  ACTIVITY:  You should plan to take it easy for the rest of today and you should NOT DRIVE or use heavy machinery  until tomorrow (because of the sedation medicines used during the test).    FOLLOW UP: Our staff will call the number listed on your records 48-72 hours following your procedure to check on you and address any questions or concerns that you may have regarding the information given to you following your procedure. If we do not reach you, we will leave a message.  We will attempt to reach you two times.  During this call, we will ask if you have developed any symptoms of COVID 19. If you develop any symptoms (ie: fever, flu-like symptoms, shortness of breath, cough etc.) before then, please call [phone].  If you test positive for Covid 19 in the 2 weeks post procedure, please call and report this information to us.    If any biopsies were taken you will be contacted by phone or by letter within the next 1-3 weeks.  Please call us at [phone] if you have not heard about the biopsies in 3 weeks.    SIGNATURES/CONFIDENTIALITY: You and/or your care partner have signed paperwork which will be entered into your electronic medical record.  These signatures attest to the fact that that the information above on your After Visit Summary has been reviewed and is understood.  Full responsibility of the confidentiality of this discharge information lies with you and/or your care-partner. 

## 2019-01-16 NOTE — Progress Notes (Signed)
Pt's states no medical or surgical changes since previsit or office visit. 

## 2019-01-16 NOTE — Op Note (Signed)
[facility] Patient Name: Isabel Skinner Procedure Date: 01/16/2019 11:19 AM MRN: [id number] Endoscopist: Mauri Pole , MD Age: 53 Referring MD:  Date of Birth: 23-Jan-1966 Gender: Female Account #: [id number] Procedure:                Colonoscopy Indications:              Screening for colorectal malignant neoplasm, This                            is the patient's first colonoscopy Medicines:                Monitored Anesthesia Care Procedure:                Pre-Anesthesia Assessment:                           - Prior to the procedure, a History and Physical                            was performed, and patient medications and                            allergies were reviewed. The patient's tolerance of                            previous anesthesia was also reviewed. The risks                            and benefits of the procedure and the sedation                            options and risks were discussed with the patient.                            All questions were answered, and informed consent                            was obtained. Prior Anticoagulants: The patient has                            taken no previous anticoagulant or antiplatelet                            agents. ASA Grade Assessment: II - A patient with                            mild systemic disease. After reviewing the risks                            and benefits, the patient was deemed in                            satisfactory condition to undergo the procedure.  After obtaining informed consent, the colonoscope                            was passed under direct vision. Throughout the                            procedure, the patient's blood pressure, pulse, and                            oxygen saturations were monitored continuously. The                            Colonoscope was introduced through the anus and                            advanced to the the  cecum, identified by                            appendiceal orifice and ileocecal valve. The                            colonoscopy was performed without difficulty. The                            patient tolerated the procedure well. The quality                            of the bowel preparation was good. The ileocecal                            valve, appendiceal orifice, and rectum were                            photographed. Scope In: 11:22:36 AM Scope Out: 11:40:39 AM Scope Withdrawal Time: 0 hours 11 minutes 13 seconds  Total Procedure Duration: 0 hours 18 minutes 3 seconds  Findings:                 The perianal and digital rectal examinations were                            normal.                           Scattered small and large-mouthed diverticula were                            found in the transverse colon, ascending colon and                            cecum. There was evidence of an impacted                            diverticulum.  Non-bleeding internal hemorrhoids were found during                            retroflexion. The hemorrhoids were small.                           The exam was otherwise without abnormality. Complications:            No immediate complications. Estimated Blood Loss:     Estimated blood loss: none. Impression:               - Moderate diverticulosis in the transverse colon,                            in the ascending colon and in the cecum. There was                            evidence of an impacted diverticulum.                           - Non-bleeding internal hemorrhoids.                           - The examination was otherwise normal.                           - No specimens collected. Recommendation:           - Patient has a contact number available for                            emergencies. The signs and symptoms of potential                            delayed complications were discussed with the                             patient. Return to normal activities tomorrow.                            Written discharge instructions were provided to the                            patient.                           - Resume previous diet.                           - Continue present medications.                           - Repeat colonoscopy in 10 years for screening                            purposes. Mauri Pole, MD 01/16/2019 11:46:00 AM This report has been signed electronically.

## 2019-01-18 ENCOUNTER — Telehealth: Payer: Self-pay

## 2019-01-18 NOTE — Telephone Encounter (Signed)
First attempt at follow up call, left message for pt to call if problems, otherwise no need to call back and we will try her back this afternoon.

## 2019-01-18 NOTE — Telephone Encounter (Signed)
  Follow up Call-  Call back number 01/16/2019  Post procedure Call Back phone  # [phone]  Permission to leave phone message Yes  Some recent data might be hidden     Patient questions:  Do you have a fever, pain , or abdominal swelling? No. Pain Score  0 *  Have you tolerated food without any problems? Yes.    Have you been able to return to your normal activities? Yes.    Do you have any questions about your discharge instructions: Diet   No. Medications  No. Follow up visit  No.  Do you have questions or concerns about your Care? No.  Actions: * If pain score is 4 or above: No action needed, pain <4. 1. Have you developed a fever since your procedure? no  2.   Have you had an respiratory symptoms (SOB or cough) since your procedure? no  3.   Have you tested positive for COVID 19 since your procedure no  4.   Have you had any family members/close contacts diagnosed with the COVID 19 since your procedure?  no   If yes to any of these questions please route to Joylene John, RN and Alphonsa Gin, Therapist, sports.

## 2019-01-31 ENCOUNTER — Other Ambulatory Visit (INDEPENDENT_AMBULATORY_CARE_PROVIDER_SITE_OTHER): Payer: BC Managed Care – PPO

## 2019-01-31 ENCOUNTER — Encounter: Payer: Self-pay | Admitting: Internal Medicine

## 2019-01-31 ENCOUNTER — Other Ambulatory Visit: Payer: Self-pay

## 2019-01-31 ENCOUNTER — Ambulatory Visit (INDEPENDENT_AMBULATORY_CARE_PROVIDER_SITE_OTHER): Payer: BC Managed Care – PPO | Admitting: Internal Medicine

## 2019-01-31 VITALS — BP 132/90 | HR 83 | Temp 97.9°F | Ht 64.0 in | Wt 171.0 lb

## 2019-01-31 DIAGNOSIS — Z7721 Contact with and (suspected) exposure to potentially hazardous body fluids: Secondary | ICD-10-CM

## 2019-01-31 DIAGNOSIS — E559 Vitamin D deficiency, unspecified: Secondary | ICD-10-CM

## 2019-01-31 DIAGNOSIS — Z0000 Encounter for general adult medical examination without abnormal findings: Secondary | ICD-10-CM

## 2019-01-31 LAB — CBC WITH DIFFERENTIAL/PLATELET
Basophils Absolute: 0 10*3/uL (ref 0.0–0.1)
Basophils Relative: 0.7 % (ref 0.0–3.0)
Eosinophils Absolute: 0.3 10*3/uL (ref 0.0–0.7)
Eosinophils Relative: 4.6 % (ref 0.0–5.0)
HCT: 37.8 % (ref 36.0–46.0)
Hemoglobin: 12.4 g/dL (ref 12.0–15.0)
Lymphocytes Relative: 35.5 % (ref 12.0–46.0)
Lymphs Abs: 2.1 10*3/uL (ref 0.7–4.0)
MCHC: 32.8 g/dL (ref 30.0–36.0)
MCV: 85.9 fl (ref 78.0–100.0)
Monocytes Absolute: 0.5 10*3/uL (ref 0.1–1.0)
Monocytes Relative: 8 % (ref 3.0–12.0)
Neutro Abs: 3 10*3/uL (ref 1.4–7.7)
Neutrophils Relative %: 51.2 % (ref 43.0–77.0)
Platelets: 329 10*3/uL (ref 150.0–400.0)
RBC: 4.4 Mil/uL (ref 3.87–5.11)
RDW: 13.5 % (ref 11.5–15.5)
WBC: 5.9 10*3/uL (ref 4.0–10.5)

## 2019-01-31 LAB — HEPATIC FUNCTION PANEL
ALT: 9 U/L (ref 0–35)
AST: 14 U/L (ref 0–37)
Albumin: 4.4 g/dL (ref 3.5–5.2)
Alkaline Phosphatase: 58 U/L (ref 39–117)
Bilirubin, Direct: 0.1 mg/dL (ref 0.0–0.3)
Total Bilirubin: 0.2 mg/dL (ref 0.2–1.2)
Total Protein: 7.2 g/dL (ref 6.0–8.3)

## 2019-01-31 LAB — LIPID PANEL
Cholesterol: 188 mg/dL (ref 0–200)
HDL: 60.3 mg/dL (ref >39.00)
LDL Cholesterol: 117 mg/dL — ABNORMAL HIGH (ref 0–99)
NonHDL: 128.08
Total CHOL/HDL Ratio: 3
Triglycerides: 53 mg/dL (ref 0.0–149.0)
VLDL: 10.6 mg/dL (ref 0.0–40.0)

## 2019-01-31 LAB — BASIC METABOLIC PANEL
BUN: 13 mg/dL (ref 6–23)
CO2: 26 mEq/L (ref 19–32)
Calcium: 9.4 mg/dL (ref 8.4–10.5)
Chloride: 107 mEq/L (ref 96–112)
Creatinine, Ser: 0.67 mg/dL (ref 0.40–1.20)
GFR: 110.82 mL/min (ref >60.00)
Glucose, Bld: 88 mg/dL (ref 70–99)
Potassium: 4.2 mEq/L (ref 3.5–5.1)
Sodium: 140 mEq/L (ref 135–145)

## 2019-01-31 LAB — TSH: TSH: 1.14 u[IU]/mL (ref 0.35–4.50)

## 2019-01-31 LAB — URINALYSIS
Bilirubin Urine: NEGATIVE
Hgb urine dipstick: NEGATIVE
Ketones, ur: NEGATIVE
Leukocytes,Ua: NEGATIVE
Nitrite: NEGATIVE
Specific Gravity, Urine: 1.025 (ref 1.000–1.030)
Total Protein, Urine: NEGATIVE
Urine Glucose: NEGATIVE
Urobilinogen, UA: 0.2 (ref 0.0–1.0)
pH: 6 (ref 5.0–8.0)

## 2019-01-31 MED ORDER — MONTELUKAST SODIUM 10 MG PO TABS: 10.0000 mg | ORAL_TABLET | Freq: Every day | ORAL | 3 refills | Status: DC

## 2019-01-31 MED ORDER — CETIRIZINE HCL 10 MG PO TABS: 10.0000 mg | ORAL_TABLET | Freq: Every day | ORAL | 3 refills | Status: DC

## 2019-01-31 MED ORDER — POLYETHYLENE GLYCOL 3350 17 GM/SCOOP PO POWD: 17.0000 g | Freq: Two times a day (BID) | ORAL | 4 refills | Status: DC | PRN

## 2019-01-31 MED ORDER — FLUTICASONE PROPIONATE 50 MCG/ACT NA SUSP: 2.0000 | Freq: Every day | NASAL | 3 refills | Status: DC

## 2019-01-31 NOTE — Progress Notes (Signed)
Subjective:  Patient ID: Isabel Skinner, female    DOB: 1965/09/17  Age: 53 y.o. MRN: [id number]  CC: Annual Exam   HPI Isabel Skinner presents for a well exam  C/o wt dgain C/o marital stress - better. Pt wants blood tests  Outpatient Medications Prior to Visit  Medication Sig Dispense Refill  . acetaminophen (TYLENOL) 325 MG tablet Take 650 mg by mouth as needed.    Marland Kitchen albuterol (PROVENTIL HFA;VENTOLIN HFA) 108 (90 Base) MCG/ACT inhaler Inhale 2 puffs into the lungs every 6 (six) hours as needed for wheezing or shortness of breath. 1 Inhaler 0  . cetirizine (ZYRTEC) 10 MG tablet Take 1 tablet (10 mg total) by mouth daily. 90 tablet 3  . Cholecalciferol (VITAMIN D3) 2000 units capsule Take 1 capsule (2,000 Units total) by mouth daily. 100 capsule 3  . fluticasone (FLONASE) 50 MCG/ACT nasal spray Place 2 sprays into both nostrils daily. 48 g 3  . ibuprofen (ADVIL,MOTRIN) 600 MG tablet Take twice a day x 1 week, then prn pain 60 tablet 0  . montelukast (SINGULAIR) 10 MG tablet Take 1 tablet (10 mg total) by mouth daily. (Patient taking differently: Take 10 mg by mouth at bedtime. ) 90 tablet 3  . oseltamivir (TAMIFLU) 75 MG capsule Take 1 capsule (75 mg total) by mouth 2 (two) times daily. (Patient not taking: Reported on 01/31/2019) 10 capsule 0   No facility-administered medications prior to visit.     ROS: Review of Systems  Constitutional: Positive for unexpected weight change. Negative for activity change, appetite change, chills and fatigue.  HENT: Negative for congestion, mouth sores and sinus pressure.   Eyes: Negative for visual disturbance.  Respiratory: Negative for cough and chest tightness.   Gastrointestinal: Negative for abdominal pain and nausea.  Genitourinary: Negative for difficulty urinating, frequency and vaginal pain.  Musculoskeletal: Negative for back pain and gait problem.  Skin: Negative for pallor and rash.  Neurological: Negative for dizziness, tremors,  weakness, numbness and headaches.  Psychiatric/Behavioral: Negative for confusion, sleep disturbance and suicidal ideas.    Objective:  BP 132/90 (BP Location: Left Arm, Patient Position: Sitting, Cuff Size: Normal)   Pulse 83   Temp 97.9 F (36.6 C) (Oral)   Ht 5\' 4"  (1.626 m)   Wt 171 lb (77.6 kg)   LMP 10/03/2015 (Approximate)   SpO2 99%   BMI 29.35 kg/m   BP Readings from Last 3 Encounters:  01/31/19 132/90  01/16/19 126/75  09/03/18 136/80    Wt Readings from Last 3 Encounters:  01/31/19 171 lb (77.6 kg)  01/16/19 164 lb (74.4 kg)  01/02/19 164 lb (74.4 kg)    Physical Exam Constitutional:      General: She is not in acute distress.    Appearance: She is well-developed.  HENT:     Head: Normocephalic.     Right Ear: External ear normal.     Left Ear: External ear normal.     Nose: Nose normal.  Eyes:     General:        Right eye: No discharge.        Left eye: No discharge.     Conjunctiva/sclera: Conjunctivae normal.     Pupils: Pupils are equal, round, and reactive to light.  Neck:     Musculoskeletal: Normal range of motion and neck supple.     Thyroid: No thyromegaly.     Vascular: No JVD.     Trachea: No tracheal deviation.  Cardiovascular:     Rate and Rhythm: Normal rate and regular rhythm.     Heart sounds: Normal heart sounds.  Pulmonary:     Effort: No respiratory distress.     Breath sounds: No stridor. No wheezing.  Abdominal:     General: Bowel sounds are normal. There is no distension.     Palpations: Abdomen is soft. There is no mass.     Tenderness: There is no abdominal tenderness. There is no guarding or rebound.  Musculoskeletal:        General: No tenderness.  Lymphadenopathy:     Cervical: No cervical adenopathy.  Skin:    Findings: No erythema or rash.  Neurological:     Mental Status: She is oriented to person, place, and time.     Cranial Nerves: No cranial nerve deficit.     Motor: No abnormal muscle tone.      Coordination: Coordination normal.     Deep Tendon Reflexes: Reflexes normal.  Psychiatric:        Behavior: Behavior normal.        Thought Content: Thought content normal.        Judgment: Judgment normal.     Lab Results  Component Value Date   WBC 6.6 12/15/2017   HGB 13.1 12/15/2017   HCT 39.6 12/15/2017   PLT 393.0 12/15/2017   GLUCOSE 94 12/15/2017   CHOL 174 12/15/2017   TRIG 49.0 12/15/2017   HDL 50.80 12/15/2017   LDLCALC 113 (H) 12/15/2017   ALT 11 12/15/2017   AST 14 12/15/2017   NA 141 12/15/2017   K 4.3 12/15/2017   CL 106 12/15/2017   CREATININE 0.63 12/15/2017   BUN 11 12/15/2017   CO2 26 12/15/2017   TSH 1.03 12/15/2017   HGBA1C 5.8 05/25/2015    Dg Chest 2 View  Result Date: 03/15/2017 CLINICAL DATA:  Posterior right-sided chest pain. Sharp pain with deep breath. EXAM: CHEST  2 VIEW COMPARISON:  None. FINDINGS: The heart size and mediastinal contours are within normal limits. Both lungs are clear. The visualized skeletal structures are unremarkable. IMPRESSION: Negative two view chest x-ray Electronically Signed   By: San Morelle M.D.   On: 03/15/2017 13:39    Assessment & Plan:     Follow-up: No follow-ups on file.  Walker Kehr, MD

## 2019-01-31 NOTE — Assessment & Plan Note (Signed)
We discussed age appropriate health related issues, including available/recomended screening tests and vaccinations. We discussed a need for adhering to healthy diet and exercise. Labs were ordered to be later reviewed . All questions were answered. GYN, Ophth -  q 2 years Menopausal - hot flashes

## 2019-01-31 NOTE — Patient Instructions (Signed)
These suggestions will probably help you to improve your metabolism if you are not overweight and to lose weight if you are overweight: 1.  Reduce your consumption of sugars and starches.  Eliminate high fructose corn syrup from your diet.  Reduce your consumption of processed foods.  For desserts try to have seasonal fruits, berries, nuts, cheeses or dark chocolate with more than 70% cacao. 2.  Do not snack 3.  You do not have to eat breakfast.  If you choose to have breakfast-eat plain greek yogurt, eggs, oatmeal (without sugar) 4.  Drink water, freshly brewed unsweetened tea (green, black or herbal) or coffee.  Do not drink sodas including diet sodas , juices, beverages sweetened with artificial sweeteners. 5.  Reduce your consumption of refined grains. 6.  Avoid protein drinks such as Optifast, Slim fast etc. Eat chicken, fish, meat, dairy and beans for your sources of protein 7.  Natural unprocessed fats like cold pressed virgin olive oil, butter, coconut oil are good for you.  Eat avocados 8.  Increase your consumption of fiber.  Fruits, berries, vegetables, whole grains, flaxseeds, Chia seeds, beans, popcorn, nuts, oatmeal are good sources of fiber 9.  Use vinegar in your diet, i.e. apple cider vinegar, red wine or balsamic vinegar 10.  You can try fasting.  For example you can skip breakfast and lunch every other day (24-hour fast) 11.  Stress reduction, good night sleep, relaxation, meditation, yoga and other physical activity is likely to help you to maintain low weight too. 12.  If you drink alcohol, limit your alcohol intake to no more than 2 drinks a day.   Mediterranean diet is good for you. ([employer] has a typical Mediterranean cuisine menu) The Mediterranean diet is a way of eating based on the traditional cuisine of countries bordering the [place] Sea. While there is no single definition of the Mediterranean diet, it is typically high in vegetables, fruits, whole grains,  beans, nut and seeds, and olive oil. The main components of Mediterranean diet include: . Daily consumption of vegetables, fruits, whole grains and healthy fats  . Weekly intake of fish, poultry, beans and eggs  . Moderate portions of dairy products  . Limited intake of red meat Other important elements of the Mediterranean diet are sharing meals with family and friends, enjoying a glass of red wine and being physically active. Health benefits of a Mediterranean diet: A traditional Mediterranean diet consisting of large quantities of fresh fruits and vegetables, nuts, fish and olive oil-coupled with physical activity-can reduce your risk of serious mental and physical health problems by: Preventing heart disease and strokes. Following a Mediterranean diet limits your intake of refined breads, processed foods, and red meat, and encourages drinking red wine instead of hard liquor-all factors that can help prevent heart disease and stroke. Keeping you agile. If you're an older adult, the nutrients gained with a Mediterranean diet may reduce your risk of developing muscle weakness and other signs of frailty by about 70 percent. Reducing the risk of Alzheimer's. Research suggests that the Mediterranean diet may improve cholesterol, blood sugar levels, and overall blood vessel health, which in turn may reduce your risk of Alzheimer's disease or dementia. Halving the risk of Parkinson's disease. The high levels of antioxidants in the Mediterranean diet can prevent cells from undergoing a damaging process called oxidative stress, thereby cutting the risk of Parkinson's disease in half. Increasing longevity. By reducing your risk of developing heart disease or cancer with the Mediterranean diet,   you're reducing your risk of death at any age by 20%. Protecting against type 2 diabetes. A Mediterranean diet is rich in fiber which digests slowly, prevents huge swings in blood sugar, and can help you maintain a  healthy weight.    Cabbage soup recipe that will not make you gain weight: Take 1 small head of cabbage, 1 average pack of celery, 4 green peppers, 4 onions, 2 cans diced tomatoes (they are not available without salt), salt and spices to taste.  Chop cabbage, celery, peppers and onions.  And tomatoes and 2-2.5 liters (2.5 quarts) of water so that it would just cover the vegetables.  Bring to boil.  Add spices and salt.  Turn heat to low/medium and simmer for 20-25 minutes.  Naturally, you can make a smaller batch and change some of the ingredients.   Cardiac CT calcium scoring test $150   Computed tomography, more commonly known as a CT or CAT scan, is a diagnostic medical imaging test. Like traditional x-rays, it produces multiple images or pictures of the inside of the body. The cross-sectional images generated during a CT scan can be reformatted in multiple planes. They can even generate three-dimensional images. These images can be viewed on a computer monitor, printed on film or by a 3D printer, or transferred to a CD or DVD. CT images of internal organs, bones, soft tissue and blood vessels provide greater detail than traditional x-rays, particularly of soft tissues and blood vessels. A cardiac CT scan for coronary calcium is a non-invasive way of obtaining information about the presence, location and extent of calcified plaque in the coronary arteries-the vessels that supply oxygen-containing blood to the heart muscle. Calcified plaque results when there is a build-up of fat and other substances under the inner layer of the artery. This material can calcify which signals the presence of atherosclerosis, a disease of the vessel wall, also called coronary artery disease (CAD). People with this disease have an increased risk for heart attacks. In addition, over time, progression of plaque build up (CAD) can narrow the arteries or even close off blood flow to the heart. The result may be chest pain,  sometimes called "angina," or a heart attack. Because calcium is a marker of CAD, the amount of calcium detected on a cardiac CT scan is a helpful prognostic tool. The findings on cardiac CT are expressed as a calcium score. Another name for this test is coronary artery calcium scoring.  What are some common uses of the procedure? The goal of cardiac CT scan for calcium scoring is to determine if CAD is present and to what extent, even if there are no symptoms. It is a screening study that may be recommended by a physician for patients with risk factors for CAD but no clinical symptoms. The major risk factors for CAD are: . high blood cholesterol levels  . family history of heart attacks  . diabetes  . high blood pressure  . cigarette smoking  . overweight or obese  . physical inactivity   A negative cardiac CT scan for calcium scoring shows no calcification within the coronary arteries. This suggests that CAD is absent or so minimal it cannot be seen by this technique. The chance of having a heart attack over the next two to five years is very low under these circumstances. A positive test means that CAD is present, regardless of whether or not the patient is experiencing any symptoms. The amount of calcification-expressed as the calcium score-may  help to predict the likelihood of a myocardial infarction (heart attack) in the coming years and helps your medical doctor or cardiologist decide whether the patient may need to take preventive medicine or undertake other measures such as diet and exercise to lower the risk for heart attack. The extent of CAD is graded according to your calcium score:  Calcium Score  Presence of CAD (coronary artery disease)  0 No evidence of CAD   1-10 Minimal evidence of CAD  11-100 Mild evidence of CAD  101-400 Moderate evidence of CAD  Over 400 Extensive evidence of CAD

## 2019-02-01 LAB — RPR: RPR Ser Ql: NONREACTIVE

## 2019-02-01 LAB — HIV ANTIBODY (ROUTINE TESTING W REFLEX): HIV 1&2 Ab, 4th Generation: NONREACTIVE

## 2019-02-03 ENCOUNTER — Encounter: Payer: Self-pay | Admitting: Internal Medicine

## 2019-02-03 NOTE — Assessment & Plan Note (Signed)
Labs

## 2019-02-06 LAB — GC/CHLAMYDIA PROBE AMP
Chlamydia trachomatis, NAA: NEGATIVE
Neisseria Gonorrhoeae by PCR: NEGATIVE

## 2019-05-09 ENCOUNTER — Ambulatory Visit (INDEPENDENT_AMBULATORY_CARE_PROVIDER_SITE_OTHER): Payer: BC Managed Care – PPO | Admitting: Internal Medicine

## 2019-05-09 DIAGNOSIS — R519 Headache, unspecified: Secondary | ICD-10-CM

## 2019-05-09 MED ORDER — CETIRIZINE HCL 10 MG PO TABS: 10.0000 mg | ORAL_TABLET | Freq: Every day | ORAL | 3 refills | Status: DC

## 2019-05-09 MED ORDER — ALBUTEROL SULFATE HFA 108 (90 BASE) MCG/ACT IN AERS: 2.0000 | INHALATION_SPRAY | RESPIRATORY_TRACT | 5 refills | Status: AC | PRN

## 2019-05-09 MED ORDER — FLUTICASONE PROPIONATE 50 MCG/ACT NA SUSP: 2.0000 | Freq: Every day | NASAL | 3 refills | Status: DC

## 2019-05-09 MED ORDER — HYDROCHLOROTHIAZIDE 12.5 MG PO CAPS: 12.5000 mg | ORAL_CAPSULE | Freq: Every day | ORAL | 3 refills | Status: DC

## 2019-05-09 MED ORDER — MONTELUKAST SODIUM 10 MG PO TABS: 10.0000 mg | ORAL_TABLET | Freq: Every day | ORAL | 3 refills | Status: DC

## 2019-05-09 NOTE — Progress Notes (Signed)
Virtual Visit via Video Note  I connected with Isabel Skinner on 05/09/19 at  4:00 PM EST by a video enabled telemedicine application and verified that I am speaking with the correct person using two identifiers.   I discussed the limitations of evaluation and management by telemedicine and the availability of in person appointments. The patient expressed understanding and agreed to proceed.  History of Present Illness: The patient is complaining of severe headache of 2 days duration several days ago.  It was easily the worst headache of her life.  There was no nausea vomiting over-the-counter medicines did not help  There has been no runny nose, cough, chest pain, shortness of breath, abdominal pain, diarrhea, constipation, arthralgias, skin rashes.   Observations/Objective: The patient appears to be in no acute distress, looks well.  Assessment and Plan:  See my Assessment and Plan. Follow Up Instructions:    I discussed the assessment and treatment plan with the patient. The patient was provided an opportunity to ask questions and all were answered. The patient agreed with the plan and demonstrated an understanding of the instructions.   The patient was advised to call back or seek an in-person evaluation if the symptoms worsen or if the condition fails to improve as anticipated.  I provided face-to-face time during this encounter. We were at different locations.   Walker Kehr, MD

## 2019-05-13 ENCOUNTER — Encounter: Payer: Self-pay | Admitting: Internal Medicine

## 2019-05-13 DIAGNOSIS — R519 Headache, unspecified: Secondary | ICD-10-CM | POA: Insufficient documentation

## 2019-05-13 NOTE — Assessment & Plan Note (Signed)
Head CT without contrast Go to ER if relapsed Try Excedrin

## 2019-05-14 ENCOUNTER — Other Ambulatory Visit: Payer: BC Managed Care – PPO

## 2019-05-15 ENCOUNTER — Inpatient Hospital Stay: Admission: RE | Admit: 2019-05-15 | Payer: BC Managed Care – PPO | Source: Ambulatory Visit

## 2019-05-17 ENCOUNTER — Other Ambulatory Visit: Payer: Self-pay

## 2019-05-17 ENCOUNTER — Ambulatory Visit (INDEPENDENT_AMBULATORY_CARE_PROVIDER_SITE_OTHER)
Admission: RE | Admit: 2019-05-17 | Discharge: 2019-05-17 | Disposition: A | Discharge status: Home / Self Care | Payer: BC Managed Care – PPO | Source: Ambulatory Visit | Attending: Internal Medicine | Admitting: Internal Medicine

## 2019-05-17 DIAGNOSIS — R519 Headache, unspecified: Secondary | ICD-10-CM | POA: Diagnosis not present

## 2019-06-10 ENCOUNTER — Ambulatory Visit: Payer: Self-pay | Admitting: *Deleted

## 2019-06-10 ENCOUNTER — Other Ambulatory Visit: Payer: Self-pay

## 2019-06-10 DIAGNOSIS — Z20822 Contact with and (suspected) exposure to covid-19: Secondary | ICD-10-CM

## 2019-06-10 NOTE — Telephone Encounter (Signed)
Questions regarding testing.Recommended testing today and to quarantine until results are available.  Answered all questions at this time.  Reason for Disposition . General information question, no triage required and triager able to answer question  Answer Assessment - Initial Assessment Questions 1. REASON FOR CALL or QUESTION: "What is your reason for calling today?" or "How can I best help you?" or "What question do you have that I can help answer?"     Questions regarding rapid testing vs drive thru testing.  Protocols used: INFORMATION ONLY CALL-A-AH

## 2019-06-12 LAB — NOVEL CORONAVIRUS, NAA: SARS-CoV-2, NAA: NOT DETECTED

## 2019-09-03 ENCOUNTER — Ambulatory Visit: Payer: BC Managed Care – PPO | Attending: Internal Medicine

## 2019-09-03 DIAGNOSIS — Z23 Encounter for immunization: Secondary | ICD-10-CM | POA: Insufficient documentation

## 2019-09-03 NOTE — Progress Notes (Signed)
   Covid-19 Vaccination Clinic  Name:  Isabel Skinner    MRN: [id number] DOB: [birth year]  09/03/2019  [name] was observed post Covid-19 immunization for 30 minutes based on pre-vaccination screening without incident. She was provided with Vaccine Information Sheet and instruction to access the V-Safe system.   [name] was instructed to call 911 with any severe reactions post vaccine: Marland Kitchen Difficulty breathing  . Swelling of face and throat  . A fast heartbeat  . A bad rash all over body  . Dizziness and weakness   Immunizations Administered    Name Date Dose VIS Date Route   Moderna COVID-19 Vaccine 09/03/2019  3:32 PM 0.5 mL 06/04/2019 Intramuscular   Manufacturer: Moderna   Lot: [id number]   [id number]

## 2019-10-01 ENCOUNTER — Ambulatory Visit: Payer: BC Managed Care – PPO | Attending: Internal Medicine

## 2019-10-01 DIAGNOSIS — Z23 Encounter for immunization: Secondary | ICD-10-CM

## 2019-10-01 NOTE — Progress Notes (Signed)
   Covid-19 Vaccination Clinic  Name:  Isabel Skinner    MRN: [id number] DOB: [birth year]  10/01/2019  Isabel Skinner was observed post Covid-19 immunization for 30 minutes based on pre-vaccination screening without incident. She was provided with Vaccine Information Sheet and instruction to access the V-Safe system.   Isabel Skinner was instructed to call 911 with any severe reactions post vaccine: Marland Kitchen Difficulty breathing  . Swelling of face and throat  . A fast heartbeat  . A bad rash all over body  . Dizziness and weakness   Immunizations Administered    Name Date Dose VIS Date Route   Moderna COVID-19 Vaccine 10/01/2019  3:35 PM 0.5 mL 06/04/2019 Intramuscular   Manufacturer: Moderna   Lot: [id number]   [id number]

## 2019-10-17 ENCOUNTER — Other Ambulatory Visit: Payer: Self-pay

## 2019-10-18 ENCOUNTER — Encounter: Payer: Self-pay | Admitting: Obstetrics and Gynecology

## 2019-10-18 ENCOUNTER — Ambulatory Visit: Payer: BC Managed Care – PPO | Admitting: Obstetrics and Gynecology

## 2019-10-18 VITALS — BP 120/80

## 2019-10-18 DIAGNOSIS — N6310 Unspecified lump in the right breast, unspecified quadrant: Secondary | ICD-10-CM

## 2019-10-18 NOTE — Progress Notes (Signed)
Isabel Skinner  Mar 07, 1966 [id number]  HPI The patient is a 54 y.o. [id number] who presents today for evaluation of a right breast lump.  She was a patient of Dr. Toney Rakes previously, last seen in this office 10/28/2016.  She has a high risk family history for breast cancer.  Her mother had breast cancer diagnosed at age 1 and ultimately passed away from complications.  Her sister was diagnosed in her late 60s and she is living.  Her niece has also had breast cancer diagnosed in her 71s.  The patient herself says she has been tested for BRCA and results were negative for mutation (03/2008).  Her sister tested positive.  The patient had noticed tenderness in her right breast in the last week or so and more recently has noticed a lump in the right breast.  She has more pain if she lays on her right side, she feels better sleeping in a bra.  She denies any nipple drainage.  She previously has had a lump in the right breast in a different location before which was biopsied and this was benign.  Her last screening mammogram was February 2020.  That exam noted a 2.8 cm mass in the right upper outer breast, and a 0.9 cm mass in the left upper outer breast.  Ultrasound imaging indicated these findings were likely benign.  She has not taken any postmenopausal hormone replacement.  Past medical history,surgical history, problem list, medications, allergies, family history and social history were all reviewed and documented as reviewed in the EPIC chart.   Physical Exam  BP 120/80   LMP 10/03/2015 (Approximate)   General: Pleasant female, no acute distress, alert and oriented  BREAST EXAM: right breast with a 4 x 3 cm mass on the superior aspect of the areola just above the nipple, the mass is firm and mobile and mildly tender, no skin or nipple changes or axillary nodes  Assessment 54 yo G3P2 with a large firm right breast mass  Plan I recommend proceeding with diagnostic mammography evaluation of the breast  mass.  I will have office staff contact the patient soon as possible to help coordinate this.   Joseph Pierini MD, FACOG 10/18/19

## 2019-10-21 ENCOUNTER — Telehealth: Payer: Self-pay | Admitting: *Deleted

## 2019-10-21 NOTE — Telephone Encounter (Signed)
-----  Message from Joseph Pierini, MD sent at 10/18/2019  5:13 PM EDT ----- Regarding: Diagnostic mammogram This patient has a new right breast mass just at the level of the areola, I would like her to have a diagnostic mammogram of the right breast.   She is a high risk patient for breast cancer, BRCA negative, but significant history of early onset breast cancer in her mother and sister and niece.  She also indicates that she had her second Covid vaccine right at the end of March if that matters for timing of the imaging. Thank you

## 2019-10-21 NOTE — Telephone Encounter (Signed)
Patient scheduled at [facility] on 11/05/19@ 3pm. Patient informed with time and date.

## 2019-11-06 ENCOUNTER — Encounter: Payer: Self-pay | Admitting: Gynecology

## 2019-11-07 ENCOUNTER — Encounter: Payer: Self-pay | Admitting: Gynecology

## 2019-11-12 ENCOUNTER — Other Ambulatory Visit: Payer: Self-pay

## 2019-11-13 ENCOUNTER — Ambulatory Visit: Payer: BC Managed Care – PPO | Admitting: Obstetrics and Gynecology

## 2019-11-13 ENCOUNTER — Encounter: Payer: Self-pay | Admitting: Obstetrics and Gynecology

## 2019-11-13 VITALS — BP 122/76 | Ht 64.0 in | Wt 171.0 lb

## 2019-11-13 DIAGNOSIS — Z01419 Encounter for gynecological examination (general) (routine) without abnormal findings: Secondary | ICD-10-CM | POA: Diagnosis not present

## 2019-11-13 DIAGNOSIS — R1032 Left lower quadrant pain: Secondary | ICD-10-CM | POA: Diagnosis not present

## 2019-11-13 DIAGNOSIS — Z86018 Personal history of other benign neoplasm: Secondary | ICD-10-CM

## 2019-11-13 NOTE — Addendum Note (Signed)
Addended by: Nelva Nay on: 11/13/2019 04:54 PM   Modules accepted: Orders

## 2019-11-13 NOTE — Progress Notes (Signed)
Isabel Skinner 07-22-1965 [id number]  SUBJECTIVE:  54 y.o. [id number] female for annual routine gynecologic exam and Pap smear. She has no gynecologic concerns. Regular BMs with herbalife tea, otherwise she tends to be constipated.   LLQ pain intermittently in the last year, last time she noticed this was around the time she last had a pelvic ultrasound, which was in 2013, at which time there were several small fibroids noted.  Is not currently having any pain right now.  When she does get the episodic pain it is not severe and does not radiate.  It is typically sharp and not affected by eating, physical activity, or positioning.  Not severe pain.  No pain with bowel movements or emptying of bladder.  Current Outpatient Medications  Medication Sig Dispense Refill  . acetaminophen (TYLENOL) 325 MG tablet Take 650 mg by mouth as needed.    Marland Kitchen albuterol (VENTOLIN HFA) 108 (90 Base) MCG/ACT inhaler Inhale 2 puffs into the lungs every 4 (four) hours as needed for wheezing or shortness of breath. 18 g 5  . cetirizine (ZYRTEC) 10 MG tablet Take 1 tablet (10 mg total) by mouth daily. 90 tablet 3  . Cholecalciferol (VITAMIN D3) 2000 units capsule Take 1 capsule (2,000 Units total) by mouth daily. 100 capsule 3  . fluticasone (FLONASE) 50 MCG/ACT nasal spray Place 2 sprays into both nostrils daily. 48 g 3  . hydrochlorothiazide (MICROZIDE) 12.5 MG capsule Take 1 capsule (12.5 mg total) by mouth daily. 90 capsule 3  . ibuprofen (ADVIL,MOTRIN) 600 MG tablet Take twice a day x 1 week, then prn pain 60 tablet 0  . montelukast (SINGULAIR) 10 MG tablet Take 1 tablet (10 mg total) by mouth daily. 90 tablet 3   No current facility-administered medications for this visit.   Allergies: Patient has no known allergies.  Patient's last menstrual period was 10/03/2015 (approximate).  Past medical history,surgical history, problem list, medications, allergies, family history and social history were all reviewed and  documented as reviewed in the EPIC chart.  ROS:  Feeling well. No dyspnea or chest pain on exertion.  No abdominal pain, change in bowel habits, black or bloody stools.  No urinary tract symptoms. GYN ROS: normal menses, no abnormal bleeding,+ left pelvic pain, no discharge, no breast pain or new or enlarging lumps on self exam (previous right breast mass has resolved on its own). No neurological complaints.    OBJECTIVE:  BP 122/76   Ht 5\' 4"  (1.626 m)   Wt 171 lb (77.6 kg)   LMP 10/03/2015 (Approximate)   BMI 29.35 kg/m  The patient appears well, alert, oriented x 3, in no distress. ENT normal.  Neck supple. No cervical or supraclavicular adenopathy or thyromegaly.  Lungs are clear, good air entry, no wheezes, rhonchi or rales. S1 and S2 normal, no murmurs, regular rate and rhythm.  Abdomen soft without tenderness, guarding, mass or organomegaly.  Neurological is normal, no focal findings.  BREAST EXAM: left breast normal without mass, skin or nipple changes or axillary nodes  PELVIC EXAM: VULVA: normal appearing vulva with no masses, tenderness or lesions, VAGINA: normal appearing vagina with normal color and discharge, no lesions, CERVIX: normal appearing cervix without discharge or lesions, UTERUS: Anteverted uterus is normal size, shape, consistency and nontender, ADNEXA: tenderness and fullness over the right adnexa, left adnexa without palpable mass or tenderness, PAP: Pap smear done today, thin-prep method  Chaperone: Caryn Bee present during the examination  ASSESSMENT:  54 y.o. [id number] here  for annual gynecologic exam  PLAN:   1. Postmenopausal. Previous endometrial ablation. No vaginal bleeding. Having hot flashes.  No night sweats.  Discussed trial of HRT if interested otherwise she could try soy isoflavones or black cohosh.  She would prefer to do the latter first.  Will let us know if the symptoms do not improve. 2. LLQ pain.  Fullness detected in right adnexa today  with some discomfort on palpation.  So history of uterine fibroids.  Recommend checking a pelvic ultrasound to rule out adnexal pathology.  She will plan to schedule this at checkout today. 3. Pap smear 10/2016.  Remote history of abnormal Pap smears and mild dysplasia.  Pap smear is collected today. 4. Mammogram 11/2019.  Diagnostic imaging was performed due to finding of a right breast mass on clinical exam and but the patient noticed last month, which was determined to be benign by imaging features, and has since resolved.  Normal breast exam today.  Continue routine annual mammograms. 5. Colonoscopy 2020.  Recommended that she follow up at the recommended interval.  6. DEXA never.  We will plan 1 further in the menopause. 7. Health maintenance.  No labs today as she normally has these completed with her primary care doctor.  Return annually or sooner, prn.  Joseph Pierini MD 11/13/19

## 2019-11-14 LAB — PAP IG W/ RFLX HPV ASCU

## 2020-03-10 ENCOUNTER — Encounter: Payer: Self-pay | Admitting: Obstetrics and Gynecology

## 2020-03-11 ENCOUNTER — Other Ambulatory Visit: Payer: Self-pay

## 2020-03-11 ENCOUNTER — Telehealth (INDEPENDENT_AMBULATORY_CARE_PROVIDER_SITE_OTHER): Payer: BC Managed Care – PPO | Admitting: Family

## 2020-03-11 DIAGNOSIS — J019 Acute sinusitis, unspecified: Secondary | ICD-10-CM | POA: Diagnosis not present

## 2020-03-11 MED ORDER — AMOXICILLIN-POT CLAVULANATE 875-125 MG PO TABS: 1.0000 | ORAL_TABLET | Freq: Two times a day (BID) | ORAL | 0 refills | Status: AC

## 2020-03-11 NOTE — Progress Notes (Signed)
Isabel Skinner is a 54 y.o. female with the following history as recorded in EpicCare:  Patient Active Problem List   Diagnosis Date Noted  . Acute nonintractable headache 05/13/2019  . Chest pain, atypical 03/15/2017  . Sebaceous cyst of labia 10/14/2016  . Well adult exam 05/25/2015  . Vitamin D deficiency 05/25/2015  . Breast mass, right 02/21/2014  . Hot flushes, perimenopausal 02/21/2014  . Mass of breast, left 08/09/2012  . Mass of breast, right 08/09/2012  . Fibroid uterus 05/03/2012  . Hypercholesterolemia 05/03/2012  . Family history of breast cancer in female 04/25/2012  . Family history of diabetes mellitus 04/25/2012  . URI 09/26/2007  . Allergic rhinitis 09/26/2007  . ELEVATED BLOOD PRESSURE WITHOUT DIAGNOSIS OF HYPERTENSION 09/26/2007    Current Outpatient Medications  Medication Sig Dispense Refill  . acetaminophen (TYLENOL) 325 MG tablet Take 650 mg by mouth as needed.    Marland Kitchen albuterol (VENTOLIN HFA) 108 (90 Base) MCG/ACT inhaler Inhale 2 puffs into the lungs every 4 (four) hours as needed for wheezing or shortness of breath. 18 g 5  . cetirizine (ZYRTEC) 10 MG tablet Take 1 tablet (10 mg total) by mouth daily. 90 tablet 3  . Cholecalciferol (VITAMIN D3) 2000 units capsule Take 1 capsule (2,000 Units total) by mouth daily. 100 capsule 3  . fluticasone (FLONASE) 50 MCG/ACT nasal spray Place 2 sprays into both nostrils daily. 48 g 3  . hydrochlorothiazide (MICROZIDE) 12.5 MG capsule Take 1 capsule (12.5 mg total) by mouth daily. 90 capsule 3  . ibuprofen (ADVIL,MOTRIN) 600 MG tablet Take twice a day x 1 week, then prn pain 60 tablet 0  . montelukast (SINGULAIR) 10 MG tablet Take 1 tablet (10 mg total) by mouth daily. 90 tablet 3  . amoxicillin-clavulanate (AUGMENTIN) 875-125 MG tablet Take 1 tablet by mouth 2 (two) times daily for 10 days. 20 tablet 0   No current facility-administered medications for this visit.    Allergies: Patient has no known allergies.  Past  Medical History:  Diagnosis Date  . Allergy   . BRCA1 negative   . BRCA2 negative   . Fibroadenoma    RIGHT AXILLA    Past Surgical History:  Procedure Laterality Date  . BREAST SURGERY  09/2009   RIGHT BREAST LUMP- LUMPECTOMY BENIGN.  Marland Kitchen ENDOMETRIAL ABLATION  03/31/2006   NOVASURE  . LIPOMA EXCISION  2002   RIGHT AND LEFT UNDER ARMS    Family History  Problem Relation Age of Onset  . Breast cancer Mother 56  . Diabetes Father        INSULIN  . Hypertension Father   . Breast cancer Sister 24  . ALS Brother   . Colon cancer Neg Hx   . Esophageal cancer Neg Hx   . Rectal cancer Neg Hx   . Stomach cancer Neg Hx     Social History   Tobacco Use  . Smoking status: Never Smoker  . Smokeless tobacco: Never Used  Substance Use Topics  . Alcohol use: Yes    Alcohol/week: 0.0 standard drinks    Comment: SOCIAL    Subjective:   I connected with Isabel Skinner on 03/11/20 at 12:00 PM EDT by a video enabled telemedicine application and verified that I am speaking with the correct person using two identifiers.   I discussed the limitations of evaluation and management by telemedicine and the availability of in person appointments. The patient expressed understanding and agreed to proceed. Provider in office/ patient  is at home; provider and patient are only 2 people on video call.   Concern for sinus infection; symptoms x 1 week; +head congestion; + chest congestion; negative COVID test today; using her allergy medication;     Objective:  There were no vitals filed for this visit.  General: Well developed, well nourished, in no acute distress  Lungs: Respirations unlabored;  Neurologic: Alert and oriented; speech intact;   Assessment:  1. Acute sinusitis, recurrence not specified, unspecified location     Plan:  Rx for Augmentin 875 mg bid x 10 days; follow-up worse, no better.   No follow-ups on file.  No orders of the defined types were placed in this encounter.    Requested Prescriptions   Signed Prescriptions Disp Refills  . amoxicillin-clavulanate (AUGMENTIN) 875-125 MG tablet 20 tablet 0    Sig: Take 1 tablet by mouth 2 (two) times daily for 10 days.

## 2020-07-12 ENCOUNTER — Other Ambulatory Visit: Payer: BC Managed Care – PPO

## 2020-07-16 ENCOUNTER — Other Ambulatory Visit: Payer: Self-pay

## 2020-12-27 ENCOUNTER — Other Ambulatory Visit: Payer: Self-pay | Admitting: Internal Medicine

## 2020-12-30 IMAGING — CT CT HEAD W/O CM
3 series · 16 of 46 positions shown, 19 images · non-contrast
Comparison: None.

CLINICAL DATA: Severe headache

EXAM:
CT HEAD WITHOUT CONTRAST
TECHNIQUE: Contiguous axial images were obtained from the base of the skull
through the vertex without intravenous contrast.

[Series 2: head 5.0 h37s · axial · 0.40mm/px · z∈[+150,+270]mm · 10 of 29 slices shown, 13 images]
[im 3/29  brain]
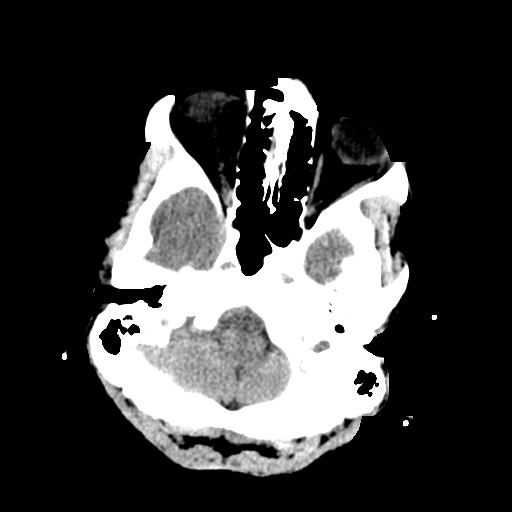
[im 3/29  bone]
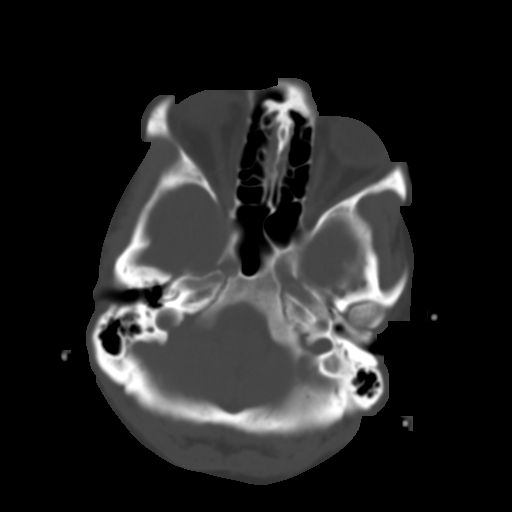
[im 6/29  brain]
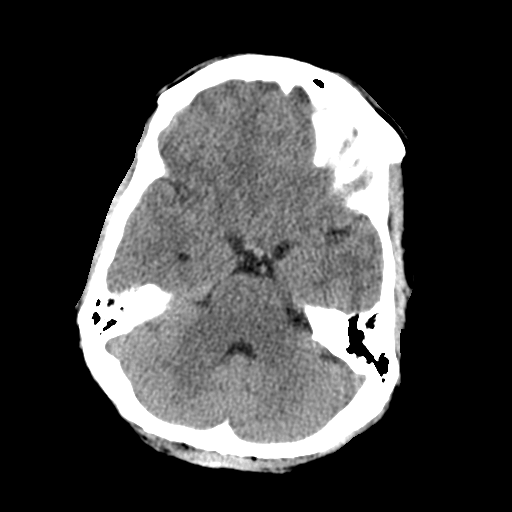
[im 8/29  brain]
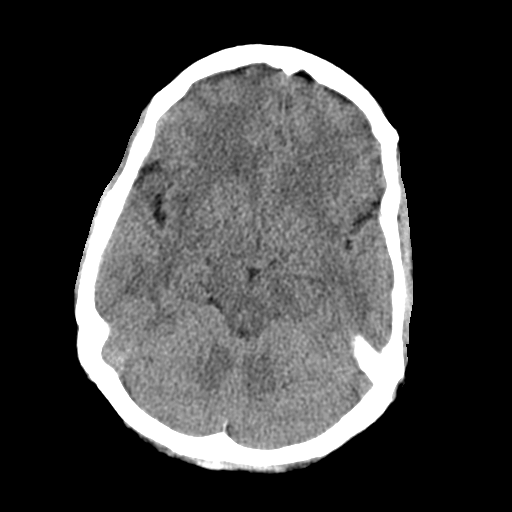
[im 11/29  brain]
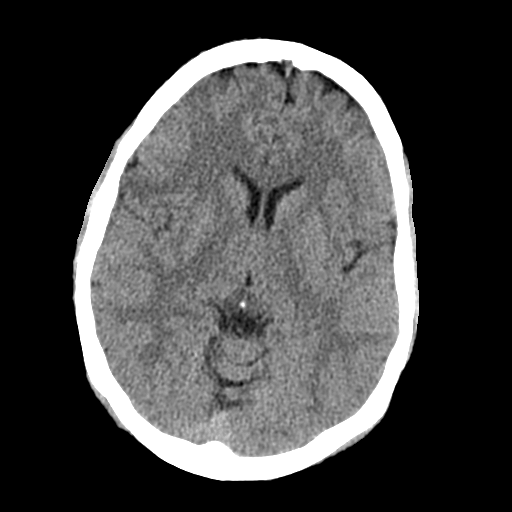
[im 14/29  brain]
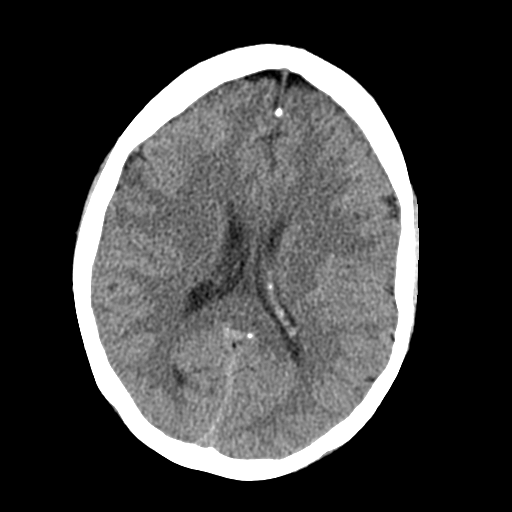
[im 14/29  bone]
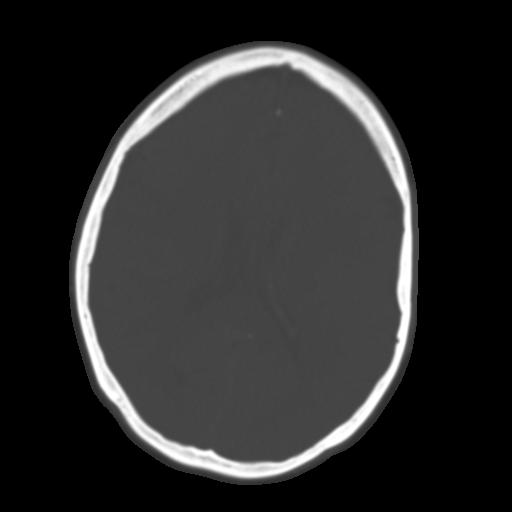
[im 16/29  brain]
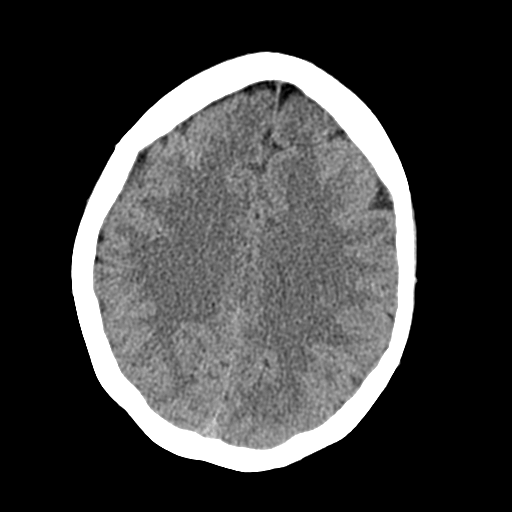
[im 19/29  brain]
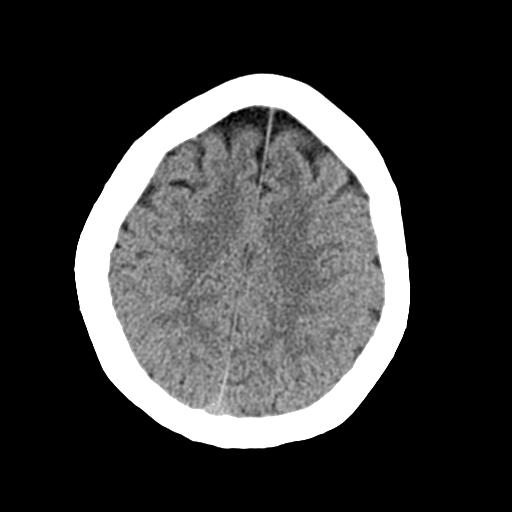
[im 22/29  brain]
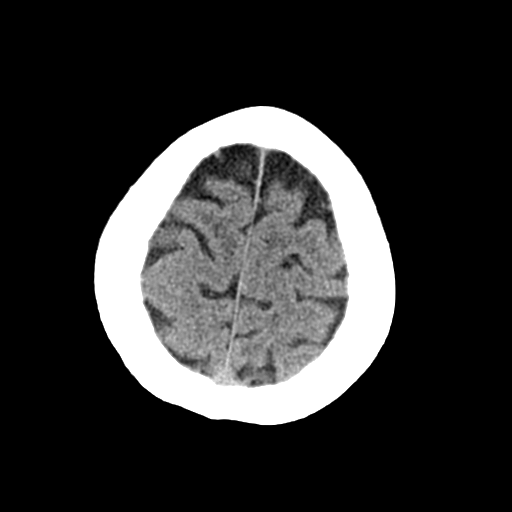
[im 24/29  brain]
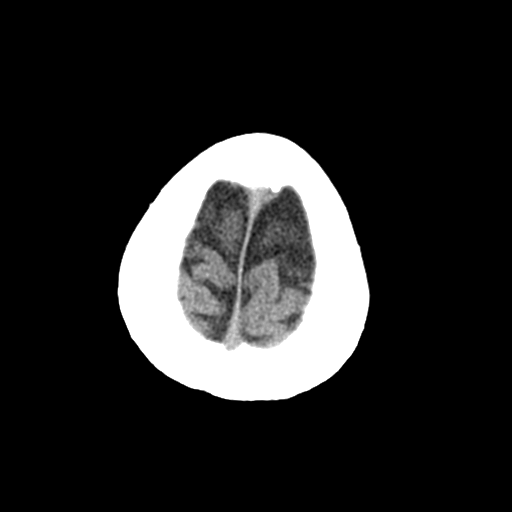
[im 24/29  bone]
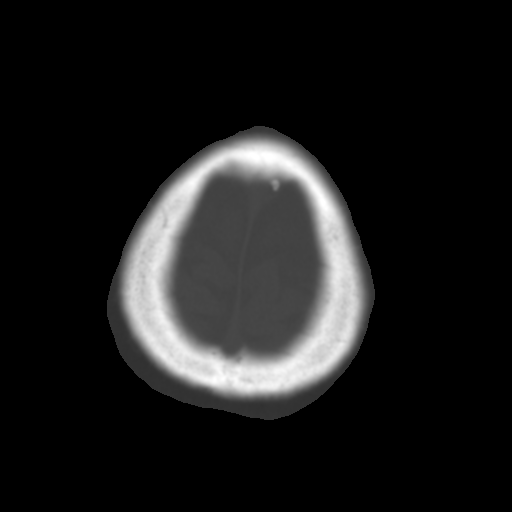
[im 27/29  brain]
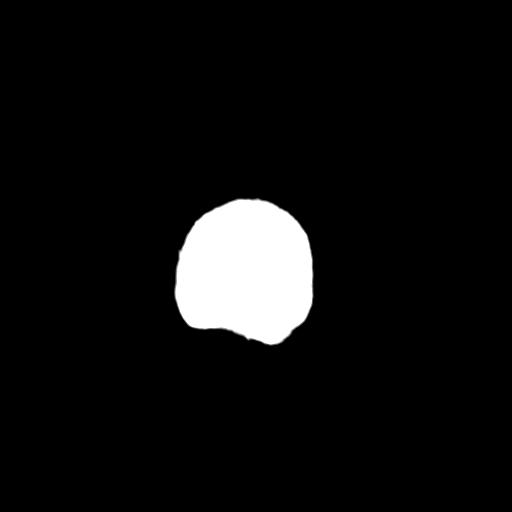

[Series 4: head 3.0 mpr cor · coronal · 0.29mm/px · 3 of 59 slices shown]
[im 20/59  brain]
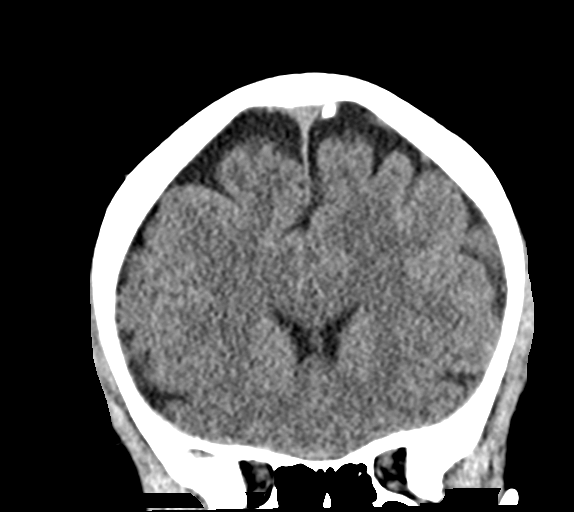
[im 26/59  brain]
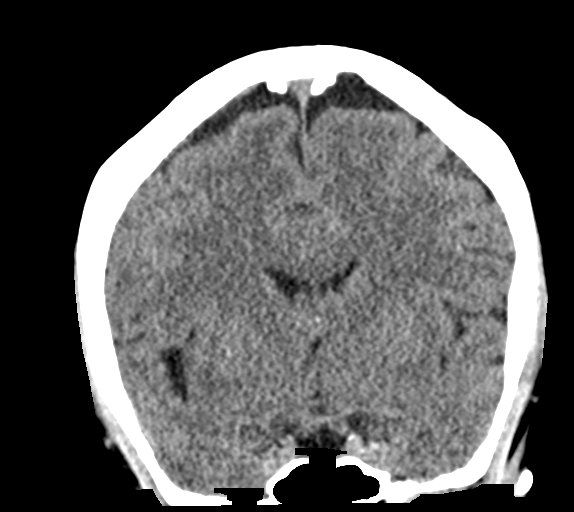
[im 33/59  brain]
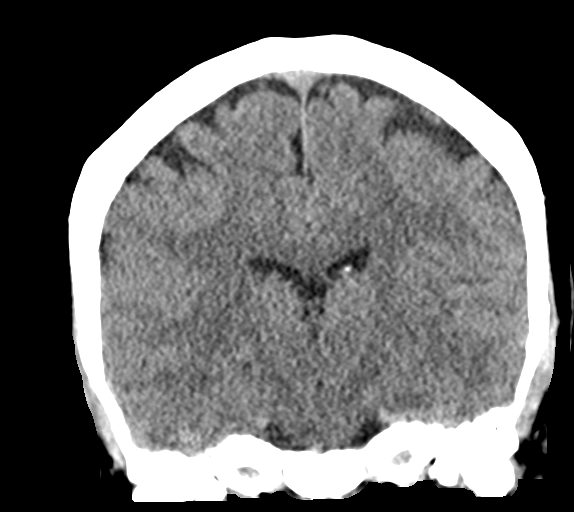

[Series 5: head 3.0 mpr sag · sagittal · 0.29mm/px · 3 of 50 slices shown]
[im 17/50  brain]
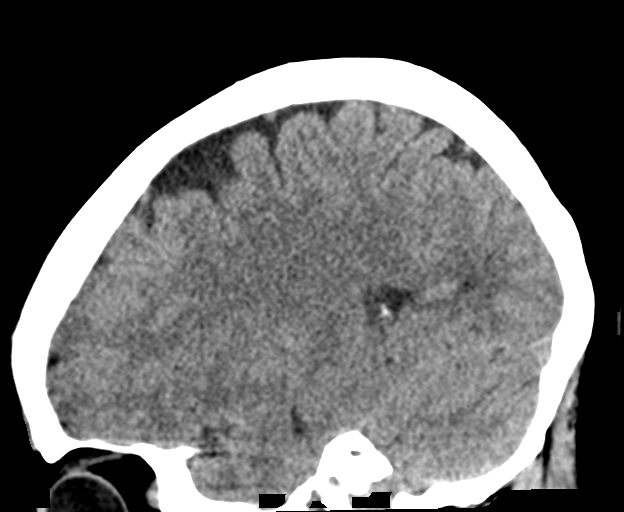
[im 25/50  brain]
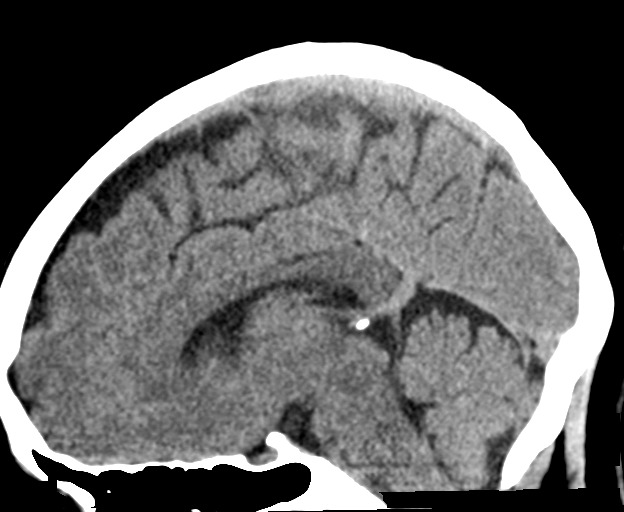
[im 33/50  brain]
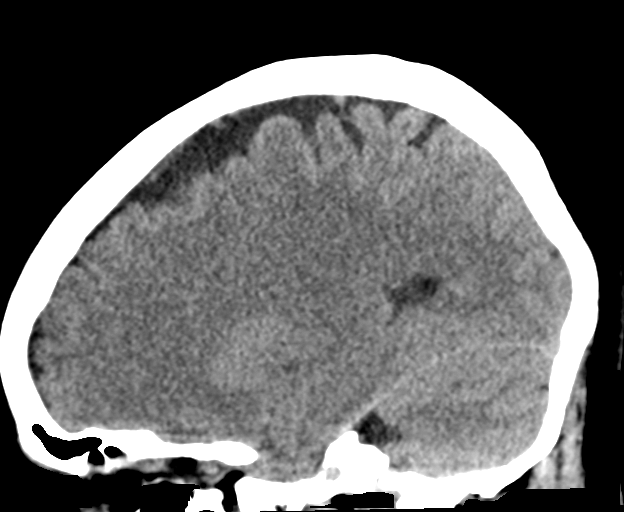

[16 of 46 positions shown; findings below may reference images not displayed]

FINDINGS: Brain: No evidence of acute infarction, hemorrhage, hydrocephalus,
extra-axial collection or mass lesion/mass effect.

Vascular: No hyperdense vessel or unexpected calcification.

Skull: Normal. Negative for fracture or focal lesion.

Sinuses/Orbits: No acute finding.

Other: None.
IMPRESSION: No acute intracranial pathology. No non-contrast CT findings to
explain headache.

## 2021-08-17 ENCOUNTER — Telehealth: Payer: Self-pay | Admitting: Internal Medicine

## 2021-08-17 NOTE — Telephone Encounter (Signed)
Ivin Booty from [facility] has called and is requesting signed copy of referral that was sent to our office.  States that the pt has a new area on L breast that she is concerned about. Pt appt is scheduled for tomorrow morning. Please check Efax folder, and send signed copy back ASAP.     Fax #- [phone]

## 2021-08-18 NOTE — Telephone Encounter (Signed)
Verbals were given... still waiting on a fax to be sent in for orders to be signed by pcp.

## 2021-08-19 ENCOUNTER — Encounter: Payer: Self-pay | Admitting: Internal Medicine

## 2021-08-19 ENCOUNTER — Other Ambulatory Visit: Payer: Self-pay

## 2021-08-19 ENCOUNTER — Ambulatory Visit (INDEPENDENT_AMBULATORY_CARE_PROVIDER_SITE_OTHER): Payer: BC Managed Care – PPO | Admitting: Internal Medicine

## 2021-08-19 VITALS — BP 134/84 | HR 91 | Temp 98.2°F | Ht 64.0 in | Wt 177.0 lb

## 2021-08-19 DIAGNOSIS — N951 Menopausal and female climacteric states: Secondary | ICD-10-CM | POA: Diagnosis not present

## 2021-08-19 DIAGNOSIS — Z0000 Encounter for general adult medical examination without abnormal findings: Secondary | ICD-10-CM

## 2021-08-19 DIAGNOSIS — E669 Obesity, unspecified: Secondary | ICD-10-CM | POA: Diagnosis not present

## 2021-08-19 MED ORDER — HYDROCHLOROTHIAZIDE 12.5 MG PO CAPS: 12.5000 mg | ORAL_CAPSULE | Freq: Every day | ORAL | 3 refills | Status: DC

## 2021-08-19 MED ORDER — WEGOVY 0.25 MG/0.5ML ~~LOC~~ SOAJ: 0.2500 mg | SUBCUTANEOUS | 2 refills | Status: DC

## 2021-08-19 MED ORDER — PHENTERMINE HCL 37.5 MG PO TABS: 37.5000 mg | ORAL_TABLET | Freq: Every day | ORAL | 2 refills | Status: DC

## 2021-08-19 MED ORDER — MONTELUKAST SODIUM 10 MG PO TABS: 10.0000 mg | ORAL_TABLET | Freq: Every day | ORAL | 3 refills | Status: DC

## 2021-08-19 NOTE — Assessment & Plan Note (Signed)
Lexapro - made her gain wt

## 2021-08-19 NOTE — Progress Notes (Signed)
Subjective:  Patient ID: Isabel Skinner, female    DOB: 1966/07/02  Age: 56 y.o. MRN: [id number]  CC: No chief complaint on file.   HPI Isabel Skinner presents for a well exam C/o hot flashes C/o wt gain - 30 lbs  Outpatient Medications Prior to Visit  Medication Sig Dispense Refill   acetaminophen (TYLENOL) 325 MG tablet Take 650 mg by mouth as needed.     albuterol (VENTOLIN HFA) 108 (90 Base) MCG/ACT inhaler Inhale 2 puffs into the lungs every 4 (four) hours as needed for wheezing or shortness of breath. 18 g 5   Cholecalciferol (VITAMIN D3) 2000 units capsule Take 1 capsule (2,000 Units total) by mouth daily. 100 capsule 3   EQ ALLERGY RELIEF, CETIRIZINE, 10 MG tablet Take 1 tablet by mouth once daily 90 tablet 3   fluticasone (FLONASE) 50 MCG/ACT nasal spray Place 2 sprays into both nostrils daily. 48 g 3   ibuprofen (ADVIL,MOTRIN) 600 MG tablet Take twice a day x 1 week, then prn pain 60 tablet 0   hydrochlorothiazide (MICROZIDE) 12.5 MG capsule Take 1 capsule (12.5 mg total) by mouth daily. 90 capsule 3   montelukast (SINGULAIR) 10 MG tablet Take 1 tablet by mouth once daily 90 tablet 3   No facility-administered medications prior to visit.    ROS: Review of Systems  Constitutional:  Positive for unexpected weight change. Negative for activity change, appetite change, chills and fatigue.  HENT:  Negative for congestion, mouth sores and sinus pressure.   Eyes:  Negative for visual disturbance.  Respiratory:  Negative for cough and chest tightness.   Gastrointestinal:  Negative for abdominal pain and nausea.  Genitourinary:  Negative for difficulty urinating, frequency and vaginal pain.  Musculoskeletal:  Negative for back pain and gait problem.  Skin:  Negative for pallor and rash.  Neurological:  Negative for dizziness, tremors, weakness, numbness and headaches.  Psychiatric/Behavioral:  Negative for confusion, sleep disturbance and suicidal ideas. The patient is not  nervous/anxious.    Objective:  BP 134/84 (BP Location: Left Arm, Patient Position: Sitting, Cuff Size: Large)    Pulse 91    Temp 98.2 F (36.8 C) (Oral)    Ht 5\' 4"  (1.626 m)    Wt 177 lb (80.3 kg)    LMP 10/03/2015 (Approximate)    SpO2 96%    BMI 30.38 kg/m   BP Readings from Last 3 Encounters:  08/19/21 134/84  11/13/19 122/76  10/18/19 120/80    Wt Readings from Last 3 Encounters:  08/19/21 177 lb (80.3 kg)  11/13/19 171 lb (77.6 kg)  01/31/19 171 lb (77.6 kg)    Physical Exam Constitutional:      General: She is not in acute distress.    Appearance: She is well-developed. She is obese.  HENT:     Head: Normocephalic.     Right Ear: External ear normal.     Left Ear: External ear normal.     Nose: Nose normal.  Eyes:     General:        Right eye: No discharge.        Left eye: No discharge.     Conjunctiva/sclera: Conjunctivae normal.     Pupils: Pupils are equal, round, and reactive to light.  Neck:     Thyroid: No thyromegaly.     Vascular: No JVD.     Trachea: No tracheal deviation.  Cardiovascular:     Rate and Rhythm: Normal rate and regular  rhythm.     Heart sounds: Normal heart sounds.  Pulmonary:     Effort: No respiratory distress.     Breath sounds: No stridor. No wheezing.  Abdominal:     General: Bowel sounds are normal. There is no distension.     Palpations: Abdomen is soft. There is no mass.     Tenderness: There is no abdominal tenderness. There is no guarding or rebound.  Musculoskeletal:        General: No tenderness.     Cervical back: Normal range of motion and neck supple. No rigidity.  Lymphadenopathy:     Cervical: No cervical adenopathy.  Skin:    Findings: No erythema or rash.  Neurological:     Mental Status: She is oriented to person, place, and time.     Cranial Nerves: No cranial nerve deficit.     Motor: No abnormal muscle tone.     Coordination: Coordination normal.     Deep Tendon Reflexes: Reflexes normal.   Psychiatric:        Behavior: Behavior normal.        Thought Content: Thought content normal.        Judgment: Judgment normal.    Lab Results  Component Value Date   WBC 5.9 01/31/2019   HGB 12.4 01/31/2019   HCT 37.8 01/31/2019   PLT 329.0 01/31/2019   GLUCOSE 88 01/31/2019   CHOL 188 01/31/2019   TRIG 53.0 01/31/2019   HDL 60.30 01/31/2019   LDLCALC 117 (H) 01/31/2019   ALT 9 01/31/2019   AST 14 01/31/2019   NA 140 01/31/2019   K 4.2 01/31/2019   CL 107 01/31/2019   CREATININE 0.67 01/31/2019   BUN 13 01/31/2019   CO2 26 01/31/2019   TSH 1.14 01/31/2019   HGBA1C 5.8 05/25/2015    CT Head Wo Contrast  Result Date: 05/17/2019 CLINICAL DATA:  Severe headache EXAM: CT HEAD WITHOUT CONTRAST TECHNIQUE: Contiguous axial images were obtained from the base of the skull through the vertex without intravenous contrast. COMPARISON:  None. FINDINGS: Brain: No evidence of acute infarction, hemorrhage, hydrocephalus, extra-axial collection or mass lesion/mass effect. Vascular: No hyperdense vessel or unexpected calcification. Skull: Normal. Negative for fracture or focal lesion. Sinuses/Orbits: No acute finding. Other: None. IMPRESSION: No acute intracranial pathology. No non-contrast CT findings to explain headache. Electronically Signed   By: Eddie Candle M.D.   On: 05/17/2019 09:29    Assessment & Plan:   Problem List Items Addressed This Visit     Hot flushes, perimenopausal     Lexapro - made her gain wt      Relevant Medications   hydrochlorothiazide (MICROZIDE) 12.5 MG capsule   Obesity (BMI 30-39.9)    Mild.  BMI 30.  Kaylee can introduce intermittent fasting.  Continue with low-carb diet for her weight loss endeavor Other options discussed.  Phentermine prescription given.  Potential benefits of a short/long term phentermine use as well as potential risks  and complications were explained to the patient and were aknowledged.       Relevant Medications    Semaglutide-Weight Management (WEGOVY) 0.25 MG/0.5ML SOAJ   phentermine (ADIPEX-P) 37.5 MG tablet   Well adult exam - Primary   Relevant Orders   TSH   Urinalysis   CBC with Differential/Platelet   Lipid panel   Comprehensive metabolic panel      Meds ordered this encounter  Medications   hydrochlorothiazide (MICROZIDE) 12.5 MG capsule    Sig: Take 1 capsule (  12.5 mg total) by mouth daily.    Dispense:  90 capsule    Refill:  3   montelukast (SINGULAIR) 10 MG tablet    Sig: Take 1 tablet (10 mg total) by mouth daily.    Dispense:  90 tablet    Refill:  3   Semaglutide-Weight Management (WEGOVY) 0.25 MG/0.5ML SOAJ    Sig: Inject 0.25 mg into the skin once a week.    Dispense:  2 mL    Refill:  2   phentermine (ADIPEX-P) 37.5 MG tablet    Sig: Take 1 tablet (37.5 mg total) by mouth daily before breakfast.    Dispense:  30 tablet    Refill:  2      Follow-up: Return in about 3 months (around 11/16/2021) for a follow-up visit.  Walker Kehr, MD

## 2021-08-19 NOTE — Patient Instructions (Signed)
Sign up for  Digital library ( via Libby app on your phone or your ipad). If you don't have a library card  - go to any library branch. They will set you up in 15 minutes. It is free. You can check out books to read and to listen, check out magazines and newspapers, movies etc.   The Obesity Code book by Jason Fung   These suggestions will probably help you to improve your metabolism if you are not overweight and to lose weight if you are overweight: 1.  Reduce your consumption of sugars and starches.  Eliminate high fructose corn syrup from your diet.  Reduce your consumption of processed foods.  For desserts try to have seasonal fruits, berries, nuts, cheeses or dark chocolate with more than 70% cacao. 2.  Do not snack 3.  You do not have to eat breakfast.  If you choose to have breakfast - eat plain greek yogurt, eggs, oatmeal (without sugar) - use honey if you need to. 4.  Drink water, freshly brewed unsweetened tea (green, black or herbal) or coffee.  Do not drink sodas including diet sodas , juices, beverages sweetened with artificial sweeteners. 5.  Reduce your consumption of refined grains. 6.  Avoid protein drinks such as Optifast, Slim fast etc. Eat chicken, fish, meat, dairy and beans for your sources of protein. 7.  Natural unprocessed fats like cold pressed virgin olive oil, butter, coconut oil are good for you.  Eat avocados. 8.  Increase your consumption of fiber.  Fruits, berries, vegetables, whole grains, flaxseed, chia seeds, beans, popcorn, nuts, oatmeal are good sources of fiber 9.  Use vinegar in your diet, i.e. apple cider vinegar, red wine or balsamic vinegar 10.  You can try fasting.  For example you can skip breakfast and lunch every other day (24-hour fast) 11.  Stress reduction, good night sleep, relaxation, meditation, yoga and other physical activity is likely to help you to maintain low weight too. 12.  If you drink alcohol, limit your alcohol intake to no more than  2 drinks a day.  

## 2021-08-26 ENCOUNTER — Telehealth: Payer: Self-pay

## 2021-08-26 NOTE — Telephone Encounter (Signed)
PA started..  Key: [id number]

## 2021-09-13 DIAGNOSIS — E669 Obesity, unspecified: Secondary | ICD-10-CM | POA: Insufficient documentation

## 2021-09-13 NOTE — Assessment & Plan Note (Addendum)
Mild.  BMI 30.  Isabel Skinner can introduce intermittent fasting.  Continue with low-carb diet for her weight loss endeavor ?Other options discussed.  Phentermine prescription given. ? Potential benefits of a short/long term phentermine use as well as potential risks  and complications were explained to the patient and were aknowledged. ? ?

## 2021-09-26 ENCOUNTER — Encounter: Payer: Self-pay | Admitting: Internal Medicine

## 2021-09-27 NOTE — Telephone Encounter (Signed)
Pt is calling in unsure if she should continue taking Wegovy 0.25 mg. Pt states that she thought that the Rx would increase every 4 weeks. Pt is due to have her next injection today. Pt states she didn't pick up the new Rx as she wants to be sure of the dosage first. ? ?Please advise ?

## 2021-09-28 ENCOUNTER — Other Ambulatory Visit: Payer: Self-pay | Admitting: Internal Medicine

## 2021-09-28 MED ORDER — WEGOVY 0.5 MG/0.5ML ~~LOC~~ SOAJ: 0.5000 mg | SUBCUTANEOUS | 3 refills | Status: DC

## 2021-11-30 NOTE — Telephone Encounter (Addendum)
Pt is calling to report that current pharmacy is out of the 0.50 of Wegovy.   Pt is wanting to know if she can increase to the next recommended dosage or should she locate the 0.50 at another Pharmacy.  Pt started the 0.50 about 5 weeks ago. No noted side effects.  Please advise. LOV  08/19/21 ROV 12/13/21

## 2021-12-04 ENCOUNTER — Other Ambulatory Visit: Payer: Self-pay | Admitting: Internal Medicine

## 2021-12-04 MED ORDER — WEGOVY 1 MG/0.5ML ~~LOC~~ SOAJ: 1.0000 mg | SUBCUTANEOUS | 1 refills | Status: DC

## 2021-12-13 ENCOUNTER — Ambulatory Visit: Payer: BC Managed Care – PPO | Admitting: Internal Medicine

## 2021-12-13 ENCOUNTER — Encounter: Payer: Self-pay | Admitting: Internal Medicine

## 2021-12-13 DIAGNOSIS — L309 Dermatitis, unspecified: Secondary | ICD-10-CM | POA: Diagnosis not present

## 2021-12-13 DIAGNOSIS — E669 Obesity, unspecified: Secondary | ICD-10-CM

## 2021-12-13 DIAGNOSIS — Z0000 Encounter for general adult medical examination without abnormal findings: Secondary | ICD-10-CM | POA: Diagnosis not present

## 2021-12-13 DIAGNOSIS — H8110 Benign paroxysmal vertigo, unspecified ear: Secondary | ICD-10-CM | POA: Diagnosis not present

## 2021-12-13 DIAGNOSIS — E559 Vitamin D deficiency, unspecified: Secondary | ICD-10-CM

## 2021-12-13 LAB — COMPREHENSIVE METABOLIC PANEL
ALT: 12 U/L (ref 0–35)
AST: 18 U/L (ref 0–37)
Albumin: 4.3 g/dL (ref 3.5–5.2)
Alkaline Phosphatase: 57 U/L (ref 39–117)
BUN: 9 mg/dL (ref 6–23)
CO2: 27 mEq/L (ref 19–32)
Calcium: 9.7 mg/dL (ref 8.4–10.5)
Chloride: 105 mEq/L (ref 96–112)
Creatinine, Ser: 0.71 mg/dL (ref 0.40–1.20)
GFR: 94.49 mL/min (ref >60.00)
Glucose, Bld: 95 mg/dL (ref 70–99)
Potassium: 4.1 mEq/L (ref 3.5–5.1)
Sodium: 141 mEq/L (ref 135–145)
Total Bilirubin: 0.4 mg/dL (ref 0.2–1.2)
Total Protein: 7.1 g/dL (ref 6.0–8.3)

## 2021-12-13 LAB — CBC WITH DIFFERENTIAL/PLATELET
Basophils Absolute: 0 10*3/uL (ref 0.0–0.1)
Basophils Relative: 0.3 % (ref 0.0–3.0)
Eosinophils Absolute: 0.2 10*3/uL (ref 0.0–0.7)
Eosinophils Relative: 2.4 % (ref 0.0–5.0)
HCT: 37 % (ref 36.0–46.0)
Hemoglobin: 12.2 g/dL (ref 12.0–15.0)
Lymphocytes Relative: 37.2 % (ref 12.0–46.0)
Lymphs Abs: 2.7 10*3/uL (ref 0.7–4.0)
MCHC: 32.9 g/dL (ref 30.0–36.0)
MCV: 85.9 fl (ref 78.0–100.0)
Monocytes Absolute: 0.6 10*3/uL (ref 0.1–1.0)
Monocytes Relative: 8 % (ref 3.0–12.0)
Neutro Abs: 3.8 10*3/uL (ref 1.4–7.7)
Neutrophils Relative %: 52.1 % (ref 43.0–77.0)
Platelets: 323 10*3/uL (ref 150.0–400.0)
RBC: 4.31 Mil/uL (ref 3.87–5.11)
RDW: 13.5 % (ref 11.5–15.5)
WBC: 7.3 10*3/uL (ref 4.0–10.5)

## 2021-12-13 LAB — URINALYSIS
Bilirubin Urine: NEGATIVE
Hgb urine dipstick: NEGATIVE
Ketones, ur: NEGATIVE
Leukocytes,Ua: NEGATIVE
Nitrite: NEGATIVE
Specific Gravity, Urine: 1.015 (ref 1.000–1.030)
Total Protein, Urine: NEGATIVE
Urine Glucose: NEGATIVE
Urobilinogen, UA: 0.2 (ref 0.0–1.0)
pH: 6 (ref 5.0–8.0)

## 2021-12-13 LAB — LIPID PANEL
Cholesterol: 211 mg/dL — ABNORMAL HIGH (ref 0–200)
HDL: 67.3 mg/dL (ref >39.00)
LDL Cholesterol: 126 mg/dL — ABNORMAL HIGH (ref 0–99)
NonHDL: 143.48
Total CHOL/HDL Ratio: 3
Triglycerides: 89 mg/dL (ref 0.0–149.0)
VLDL: 17.8 mg/dL (ref 0.0–40.0)

## 2021-12-13 LAB — TSH: TSH: 1.38 u[IU]/mL (ref 0.35–5.50)

## 2021-12-13 MED ORDER — ONDANSETRON HCL 4 MG PO TABS: 4.0000 mg | ORAL_TABLET | Freq: Three times a day (TID) | ORAL | 0 refills | Status: DC | PRN

## 2021-12-13 MED ORDER — HYDROCHLOROTHIAZIDE 12.5 MG PO CAPS: 12.5000 mg | ORAL_CAPSULE | Freq: Every day | ORAL | 3 refills | Status: DC

## 2021-12-13 MED ORDER — WEGOVY 2.4 MG/0.75ML ~~LOC~~ SOAJ: 2.4000 mg | SUBCUTANEOUS | 3 refills | Status: DC

## 2021-12-13 NOTE — Assessment & Plan Note (Signed)
Discussed BPV

## 2021-12-13 NOTE — Assessment & Plan Note (Signed)
On Vit D 

## 2021-12-13 NOTE — Progress Notes (Signed)
Subjective:  Patient ID: Isabel Skinner, female    DOB: 05-11-1966  Age: 56 y.o. MRN: [id number]  CC: No chief complaint on file.   HPI DAWNYEL LEVEN presents for a vertigo spell x 1 wk ago for 25 min and resolved. F/u on elevated BP - SBP:167 DBP 82  Outpatient Medications Prior to Visit  Medication Sig Dispense Refill   acetaminophen (TYLENOL) 325 MG tablet Take 650 mg by mouth as needed.     albuterol (VENTOLIN HFA) 108 (90 Base) MCG/ACT inhaler Inhale 2 puffs into the lungs every 4 (four) hours as needed for wheezing or shortness of breath. 18 g 5   Cholecalciferol (VITAMIN D3) 2000 units capsule Take 1 capsule (2,000 Units total) by mouth daily. 100 capsule 3   EQ ALLERGY RELIEF, CETIRIZINE, 10 MG tablet Take 1 tablet by mouth once daily 90 tablet 3   fluticasone (FLONASE) 50 MCG/ACT nasal spray Place into the nose.     ibuprofen (ADVIL,MOTRIN) 600 MG tablet Take twice a day x 1 week, then prn pain 60 tablet 0   montelukast (SINGULAIR) 10 MG tablet Take 1 tablet (10 mg total) by mouth daily. 90 tablet 3   fluticasone (FLONASE) 50 MCG/ACT nasal spray Place 2 sprays into both nostrils daily. 48 g 3   hydrochlorothiazide (MICROZIDE) 12.5 MG capsule Take 1 capsule (12.5 mg total) by mouth daily. 90 capsule 3   Semaglutide-Weight Management (WEGOVY) 1 MG/0.5ML SOAJ Inject 1 mg into the skin once a week. 2 mL 1   phentermine (ADIPEX-P) 37.5 MG tablet Take 1 tablet (37.5 mg total) by mouth daily before breakfast. 30 tablet 2   No facility-administered medications prior to visit.    ROS: Review of Systems  Constitutional:  Negative for activity change, appetite change, chills, fatigue and unexpected weight change.  HENT:  Negative for congestion, mouth sores and sinus pressure.   Eyes:  Negative for visual disturbance.  Respiratory:  Negative for cough and chest tightness.   Gastrointestinal:  Negative for abdominal pain and nausea.  Genitourinary:  Negative for difficulty urinating,  frequency and vaginal pain.  Musculoskeletal:  Negative for back pain and gait problem.  Skin:  Negative for pallor and rash.  Neurological:  Negative for dizziness, tremors, weakness, numbness and headaches.  Psychiatric/Behavioral:  Negative for confusion and sleep disturbance.     Objective:  BP 140/90 (BP Location: Left Arm, Patient Position: Sitting, Cuff Size: Normal)   Pulse 75   Temp 98.1 F (36.7 C) (Oral)   Ht '5\' 4"'$  (1.626 m)   Wt 170 lb (77.1 kg)   LMP 10/03/2015 (Approximate)   SpO2 98%   BMI 29.18 kg/m   BP Readings from Last 3 Encounters:  12/13/21 140/90  08/19/21 134/84  11/13/19 122/76    Wt Readings from Last 3 Encounters:  12/13/21 170 lb (77.1 kg)  08/19/21 177 lb (80.3 kg)  11/13/19 171 lb (77.6 kg)    Physical Exam Constitutional:      General: She is not in acute distress.    Appearance: She is well-developed. She is obese.  HENT:     Head: Normocephalic.     Right Ear: External ear normal.     Left Ear: External ear normal.     Nose: Nose normal.  Eyes:     General:        Right eye: No discharge.        Left eye: No discharge.     Conjunctiva/sclera: Conjunctivae normal.  Pupils: Pupils are equal, round, and reactive to light.  Neck:     Thyroid: No thyromegaly.     Vascular: Carotid bruit present. No JVD.     Trachea: No tracheal deviation.  Cardiovascular:     Rate and Rhythm: Normal rate and regular rhythm.     Heart sounds: Normal heart sounds.  Pulmonary:     Effort: No respiratory distress.     Breath sounds: No stridor. No wheezing.  Abdominal:     General: Bowel sounds are normal. There is no distension.     Palpations: Abdomen is soft. There is no mass.     Tenderness: There is no abdominal tenderness. There is no guarding or rebound.  Musculoskeletal:        General: No tenderness.     Cervical back: Normal range of motion and neck supple. No rigidity.  Lymphadenopathy:     Cervical: No cervical adenopathy.   Skin:    Findings: No erythema or rash.  Neurological:     Cranial Nerves: No cranial nerve deficit.     Motor: No abnormal muscle tone.     Coordination: Coordination normal.     Deep Tendon Reflexes: Reflexes normal.  Psychiatric:        Behavior: Behavior normal.        Thought Content: Thought content normal.        Judgment: Judgment normal.     Lab Results  Component Value Date   WBC 5.9 01/31/2019   HGB 12.4 01/31/2019   HCT 37.8 01/31/2019   PLT 329.0 01/31/2019   GLUCOSE 88 01/31/2019   CHOL 188 01/31/2019   TRIG 53.0 01/31/2019   HDL 60.30 01/31/2019   LDLCALC 117 (H) 01/31/2019   ALT 9 01/31/2019   AST 14 01/31/2019   NA 140 01/31/2019   K 4.2 01/31/2019   CL 107 01/31/2019   CREATININE 0.67 01/31/2019   BUN 13 01/31/2019   CO2 26 01/31/2019   TSH 1.14 01/31/2019   HGBA1C 5.8 05/25/2015    CT Head Wo Contrast  Result Date: 05/17/2019 CLINICAL DATA:  Severe headache EXAM: CT HEAD WITHOUT CONTRAST TECHNIQUE: Contiguous axial images were obtained from the base of the skull through the vertex without intravenous contrast. COMPARISON:  None. FINDINGS: Brain: No evidence of acute infarction, hemorrhage, hydrocephalus, extra-axial collection or mass lesion/mass effect. Vascular: No hyperdense vessel or unexpected calcification. Skull: Normal. Negative for fracture or focal lesion. Sinuses/Orbits: No acute finding. Other: None. IMPRESSION: No acute intracranial pathology. No non-contrast CT findings to explain headache. Electronically Signed   By: Eddie Candle M.D.   On: 05/17/2019 09:29    Assessment & Plan:   Problem List Items Addressed This Visit     BPV (benign positional vertigo)    Discussed BPV      Dermatitis   Obesity (BMI 30-39.9)    Start Wegovy at 2.4 mg/week - pt is ok to try - other doses are n/a      Relevant Medications   Semaglutide-Weight Management (WEGOVY) 2.4 MG/0.75ML SOAJ   Vitamin D deficiency    On Vit D         Meds  ordered this encounter  Medications   hydrochlorothiazide (MICROZIDE) 12.5 MG capsule    Sig: Take 1 capsule (12.5 mg total) by mouth daily.    Dispense:  90 capsule    Refill:  3   Semaglutide-Weight Management (WEGOVY) 2.4 MG/0.75ML SOAJ    Sig: Inject 2.4 mg into the skin  once a week.    Dispense:  3 mL    Refill:  3   ondansetron (ZOFRAN) 4 MG tablet    Sig: Take 1 tablet (4 mg total) by mouth every 8 (eight) hours as needed for nausea or vomiting.    Dispense:  20 tablet    Refill:  0      Follow-up: Return in about 3 months (around 03/15/2022) for a follow-up visit.  Walker Kehr, MD

## 2021-12-13 NOTE — Assessment & Plan Note (Signed)
Start Wegovy at 2.4 mg/week - pt is ok to try - other doses are n/a

## 2022-01-12 ENCOUNTER — Other Ambulatory Visit: Payer: Self-pay | Admitting: Internal Medicine

## 2022-03-15 ENCOUNTER — Ambulatory Visit: Payer: BC Managed Care – PPO | Admitting: Internal Medicine

## 2022-03-31 ENCOUNTER — Encounter: Payer: Self-pay | Admitting: Internal Medicine

## 2022-03-31 ENCOUNTER — Ambulatory Visit: Payer: BC Managed Care – PPO | Admitting: Internal Medicine

## 2022-03-31 VITALS — BP 132/86 | HR 94 | Temp 98.2°F | Ht 64.0 in | Wt 172.4 lb

## 2022-03-31 DIAGNOSIS — R1032 Left lower quadrant pain: Secondary | ICD-10-CM

## 2022-03-31 DIAGNOSIS — Z23 Encounter for immunization: Secondary | ICD-10-CM

## 2022-03-31 DIAGNOSIS — E669 Obesity, unspecified: Secondary | ICD-10-CM | POA: Diagnosis not present

## 2022-03-31 DIAGNOSIS — E559 Vitamin D deficiency, unspecified: Secondary | ICD-10-CM | POA: Diagnosis not present

## 2022-03-31 MED ORDER — PHENTERMINE HCL 37.5 MG PO TABS: 37.5000 mg | ORAL_TABLET | Freq: Every day | ORAL | 2 refills | Status: DC

## 2022-03-31 MED ORDER — WEGOVY 2.4 MG/0.75ML ~~LOC~~ SOAJ: 2.4000 mg | SUBCUTANEOUS | 3 refills | Status: DC

## 2022-03-31 NOTE — Assessment & Plan Note (Addendum)
On Wegovy at 2.4  Mg/week.  No progress lately.  We discussed options. Added Phentermine 37.5 for added benefit  Potential benefits of  phentermine use as well as potential risks  and complications were explained to the patient and were aknowledged.

## 2022-03-31 NOTE — Progress Notes (Signed)
3  Subjective:  Patient ID: Isabel Skinner, female    DOB: May 31, 1966  Age: 56 y.o. MRN: [id number]  CC: Follow-up (3 month f/u- Flu shot)   HPI Isabel Skinner presents for obesity treatment follow-up C/o LLQ pain x 1 month sharp, periodic LMP at 56 yo  Outpatient Medications Prior to Visit  Medication Sig Dispense Refill   acetaminophen (TYLENOL) 325 MG tablet Take 650 mg by mouth as needed.     albuterol (VENTOLIN HFA) 108 (90 Base) MCG/ACT inhaler Inhale 2 puffs into the lungs every 4 (four) hours as needed for wheezing or shortness of breath. 18 g 5   cetirizine (EQ ALLERGY RELIEF, CETIRIZINE,) 10 MG tablet Take 1 tablet by mouth once daily 90 tablet 3   Cholecalciferol (VITAMIN D3) 2000 units capsule Take 1 capsule (2,000 Units total) by mouth daily. 100 capsule 3   fluticasone (FLONASE) 50 MCG/ACT nasal spray Place into the nose.     hydrochlorothiazide (MICROZIDE) 12.5 MG capsule Take 1 capsule (12.5 mg total) by mouth daily. 90 capsule 3   ibuprofen (ADVIL,MOTRIN) 600 MG tablet Take twice a day x 1 week, then prn pain 60 tablet 0   montelukast (SINGULAIR) 10 MG tablet Take 1 tablet (10 mg total) by mouth daily. 90 tablet 3   ondansetron (ZOFRAN) 4 MG tablet Take 1 tablet (4 mg total) by mouth every 8 (eight) hours as needed for nausea or vomiting. 20 tablet 0   Semaglutide-Weight Management (WEGOVY) 2.4 MG/0.75ML SOAJ Inject 2.4 mg into the skin once a week. 3 mL 3   phentermine (ADIPEX-P) 37.5 MG tablet Take 1 tablet (37.5 mg total) by mouth daily before breakfast. 30 tablet 2   No facility-administered medications prior to visit.    ROS: Review of Systems  Constitutional:  Negative for activity change, appetite change, chills, fatigue and unexpected weight change.  HENT:  Negative for congestion, mouth sores and sinus pressure.   Eyes:  Negative for visual disturbance.  Respiratory:  Negative for cough and chest tightness.   Gastrointestinal:  Negative for abdominal pain and  nausea.  Genitourinary:  Negative for difficulty urinating, frequency and vaginal pain.  Musculoskeletal:  Negative for back pain and gait problem.  Skin:  Negative for pallor and rash.  Neurological:  Negative for dizziness, tremors, weakness, numbness and headaches.  Psychiatric/Behavioral:  Negative for confusion and sleep disturbance.     Objective:  BP 132/86 (BP Location: Left Arm)   Pulse 94   Temp 98.2 F (36.8 C) (Oral)   Ht '5\' 4"'$  (1.626 m)   Wt 172 lb 6.4 oz (78.2 kg)   LMP 10/03/2015 (Approximate)   SpO2 98%   BMI 29.59 kg/m   BP Readings from Last 3 Encounters:  03/31/22 132/86  12/13/21 140/90  08/19/21 134/84    Wt Readings from Last 3 Encounters:  03/31/22 172 lb 6.4 oz (78.2 kg)  12/13/21 170 lb (77.1 kg)  08/19/21 177 lb (80.3 kg)    Physical Exam Constitutional:      General: She is not in acute distress.    Appearance: She is well-developed.  HENT:     Head: Normocephalic.     Right Ear: External ear normal.     Left Ear: External ear normal.     Nose: Nose normal.  Eyes:     General:        Right eye: No discharge.        Left eye: No discharge.  Conjunctiva/sclera: Conjunctivae normal.     Pupils: Pupils are equal, round, and reactive to light.  Neck:     Thyroid: No thyromegaly.     Vascular: No JVD.     Trachea: No tracheal deviation.  Cardiovascular:     Rate and Rhythm: Normal rate and regular rhythm.     Heart sounds: Normal heart sounds.  Pulmonary:     Effort: No respiratory distress.     Breath sounds: No stridor. No wheezing.  Abdominal:     General: Bowel sounds are normal. There is no distension.     Palpations: Abdomen is soft. There is no mass.     Tenderness: There is no abdominal tenderness. There is no guarding or rebound.  Musculoskeletal:        General: No tenderness.     Cervical back: Normal range of motion and neck supple. No rigidity.  Lymphadenopathy:     Cervical: No cervical adenopathy.  Skin:     Findings: No erythema or rash.  Neurological:     Cranial Nerves: No cranial nerve deficit.     Motor: No abnormal muscle tone.     Coordination: Coordination normal.     Deep Tendon Reflexes: Reflexes normal.  Psychiatric:        Behavior: Behavior normal.        Thought Content: Thought content normal.        Judgment: Judgment normal.   LLQ is sensitive  Lab Results  Component Value Date   WBC 7.3 12/13/2021   HGB 12.2 12/13/2021   HCT 37.0 12/13/2021   PLT 323.0 12/13/2021   GLUCOSE 95 12/13/2021   CHOL 211 (H) 12/13/2021   TRIG 89.0 12/13/2021   HDL 67.30 12/13/2021   LDLCALC 126 (H) 12/13/2021   ALT 12 12/13/2021   AST 18 12/13/2021   NA 141 12/13/2021   K 4.1 12/13/2021   CL 105 12/13/2021   CREATININE 0.71 12/13/2021   BUN 9 12/13/2021   CO2 27 12/13/2021   TSH 1.38 12/13/2021   HGBA1C 5.8 05/25/2015    CT Head Wo Contrast  Result Date: 05/17/2019 CLINICAL DATA:  Severe headache EXAM: CT HEAD WITHOUT CONTRAST TECHNIQUE: Contiguous axial images were obtained from the base of the skull through the vertex without intravenous contrast. COMPARISON:  None. FINDINGS: Brain: No evidence of acute infarction, hemorrhage, hydrocephalus, extra-axial collection or mass lesion/mass effect. Vascular: No hyperdense vessel or unexpected calcification. Skull: Normal. Negative for fracture or focal lesion. Sinuses/Orbits: No acute finding. Other: None. IMPRESSION: No acute intracranial pathology. No non-contrast CT findings to explain headache. Electronically Signed   By: Eddie Candle M.D.   On: 05/17/2019 09:29    Assessment & Plan:   Problem List Items Addressed This Visit     LLQ abdominal pain - Primary    Unclear etiology.  Differential diagnosis is broad.  Dodi will schedule an appointment with her GYN.  We reviewed the results of her recent colonoscopy.  We discussed diagnostic options including abdominal/pelvis CT scan, transvaginal pelvic ultrasound.  She would like to  start with a pelvic ultrasound.  We will schedule.      Relevant Orders   US PELVIS TRANSVAGINAL NON-OB (TV ONLY)   Obesity (BMI 30-39.9)    On Wegovy at 2.4  Mg/week.  No progress lately.  We discussed options. Added Phentermine 37.5 for added benefit  Potential benefits of  phentermine use as well as potential risks  and complications were explained to the patient and  were aknowledged.       Relevant Medications   Semaglutide-Weight Management (WEGOVY) 2.4 MG/0.75ML SOAJ   phentermine (ADIPEX-P) 37.5 MG tablet   Vitamin D deficiency    On Vit D         Meds ordered this encounter  Medications   Semaglutide-Weight Management (WEGOVY) 2.4 MG/0.75ML SOAJ    Sig: Inject 2.4 mg into the skin once a week.    Dispense:  3 mL    Refill:  3   phentermine (ADIPEX-P) 37.5 MG tablet    Sig: Take 1 tablet (37.5 mg total) by mouth daily before breakfast.    Dispense:  30 tablet    Refill:  2      Follow-up: Return in about 3 months (around 06/30/2022) for a follow-up visit.  Walker Kehr, MD

## 2022-03-31 NOTE — Assessment & Plan Note (Signed)
On Vit D 

## 2022-04-01 DIAGNOSIS — R1032 Left lower quadrant pain: Secondary | ICD-10-CM | POA: Insufficient documentation

## 2022-04-01 NOTE — Assessment & Plan Note (Signed)
Unclear etiology.  Differential diagnosis is broad.  Isabel Skinner will schedule an appointment with her GYN.  We reviewed the results of her recent colonoscopy.  We discussed diagnostic options including abdominal/pelvis CT scan, transvaginal pelvic ultrasound.  She would like to start with a pelvic ultrasound.  We will schedule.

## 2022-04-06 ENCOUNTER — Ambulatory Visit
Admission: RE | Admit: 2022-04-06 | Discharge: 2022-04-06 | Disposition: A | Discharge status: Home / Self Care | Payer: BC Managed Care – PPO | Source: Ambulatory Visit | Attending: Internal Medicine | Admitting: Internal Medicine

## 2022-04-06 DIAGNOSIS — R1032 Left lower quadrant pain: Secondary | ICD-10-CM

## 2022-04-11 ENCOUNTER — Encounter: Payer: Self-pay | Admitting: Internal Medicine

## 2022-04-12 ENCOUNTER — Other Ambulatory Visit: Payer: Self-pay | Admitting: Internal Medicine

## 2022-04-12 DIAGNOSIS — R1032 Left lower quadrant pain: Secondary | ICD-10-CM

## 2022-04-12 NOTE — Progress Notes (Signed)
Abdomen/pelvis CT ordered.

## 2022-04-13 ENCOUNTER — Telehealth: Payer: Self-pay | Admitting: *Deleted

## 2022-04-13 DIAGNOSIS — E785 Hyperlipidemia, unspecified: Secondary | ICD-10-CM

## 2022-04-13 DIAGNOSIS — R030 Elevated blood-pressure reading, without diagnosis of hypertension: Secondary | ICD-10-CM

## 2022-04-13 NOTE — Telephone Encounter (Signed)
Rec'd fax PA on Wegovy 2.4 completed and submitted w/ (Key: [id number]). Rec'd msg Your PA request has been sent to [employer].Marland KitchenJohny Chess

## 2022-04-14 NOTE — Telephone Encounter (Signed)
Rec'd determination med was DENIED. It states Your plan only covers this drug when it is used for certain health conditions. Covered uses are when you have a body mass index (BMI) of at least 30 kg/m2 or you have a BMI of at least 27 kg/m2 with one or more weight-related health condition. Your plan does not cover this drug for your health condition that your doctor told us you have.Marland KitchenJohny Chess

## 2022-04-17 DIAGNOSIS — E785 Hyperlipidemia, unspecified: Secondary | ICD-10-CM | POA: Insufficient documentation

## 2022-04-17 NOTE — Telephone Encounter (Signed)
BMI 29.  Dyslipidemia and elevated blood pressure.  Thanks

## 2022-04-21 ENCOUNTER — Ambulatory Visit
Admission: RE | Admit: 2022-04-21 | Discharge: 2022-04-21 | Disposition: A | Discharge status: Home / Self Care | Payer: BC Managed Care – PPO | Source: Ambulatory Visit | Attending: Internal Medicine | Admitting: Internal Medicine

## 2022-04-21 DIAGNOSIS — R1032 Left lower quadrant pain: Secondary | ICD-10-CM

## 2022-04-21 MED ORDER — IOPAMIDOL (ISOVUE-300) INJECTION 61%
100.0000 mL | Freq: Once | INTRAVENOUS | Status: AC | PRN
  Administered 2022-04-21: 100 mL via INTRAVENOUS

## 2022-04-21 NOTE — Telephone Encounter (Signed)
Resubmitted PA w/ (Key: [id number]). PA went to [employer].Marland KitchenJohny Chess

## 2022-04-22 NOTE — Telephone Encounter (Signed)
Rec'd determination insurance DENIED Wegovy again. It states YOUR plan only cover this drug when you experience benefits from taking the drug. JPETKK therapy and weight was reviewed. Your request been denied.Marland KitchenAndee Poles

## 2022-04-25 NOTE — Telephone Encounter (Signed)
Noted! Thank you

## 2022-04-25 NOTE — Telephone Encounter (Signed)
Let us a count check with our plan what other medication options for weight loss they cover.  Thank you

## 2022-04-25 NOTE — Telephone Encounter (Addendum)
Patient is calling in wondering what happens next. Wants to know if there is another prescription that can be prescribed.

## 2022-04-28 NOTE — Telephone Encounter (Signed)
Notified pt to contact insurance to see what is covered an let MD know.Marland KitchenJohny Chess

## 2022-05-04 NOTE — Telephone Encounter (Addendum)
Patient called and said she called insurance and they said it was denied because of info sent in and she had not lost 5% of weight, She said the last visit weight was not correct and that since she had started the medication she has lost 17 lbs. She said that it showed 170lbs on last visit notes but she said she was down to 160.3 at that time. She would like this corrected so she can continue with wegovy. She did say that the insurance said an appeal would have to be done. Call back for patient is [phone]

## 2022-05-16 NOTE — Telephone Encounter (Signed)
Patient wants to know if we heard anything from the appeal or prior authorization on the Wegovy - Please give patient a call.

## 2022-05-16 NOTE — Telephone Encounter (Signed)
Rec'd msg " Your PA request has been closed." Pt is requesting Appeal for meds.Marland KitchenJohny Skinner [phone]

## 2022-05-16 NOTE — Telephone Encounter (Signed)
Pt is wanting a Production designer, theatre/television/film for the [facility]. Need MD to write letter on why pt is needing  medications ect.Marland KitchenJohny Chess

## 2022-05-16 NOTE — Telephone Encounter (Signed)
Submitted another PA to insurance w/ (Key: [id number]) WAITING ON RESPONSE.Marland Kitchen/LMB

## 2022-05-20 NOTE — Telephone Encounter (Signed)
Noted! Thank you

## 2022-05-25 ENCOUNTER — Ambulatory Visit: Payer: BC Managed Care – PPO | Admitting: Internal Medicine

## 2022-05-25 ENCOUNTER — Encounter: Payer: Self-pay | Admitting: Internal Medicine

## 2022-05-25 VITALS — BP 132/82 | HR 78 | Temp 98.3°F | Ht 64.0 in | Wt 161.8 lb

## 2022-05-25 DIAGNOSIS — I10 Essential (primary) hypertension: Secondary | ICD-10-CM

## 2022-05-25 DIAGNOSIS — E669 Obesity, unspecified: Secondary | ICD-10-CM | POA: Diagnosis not present

## 2022-05-25 DIAGNOSIS — E7800 Pure hypercholesterolemia, unspecified: Secondary | ICD-10-CM

## 2022-05-25 MED ORDER — PHENTERMINE HCL 37.5 MG PO TABS: 37.5000 mg | ORAL_TABLET | Freq: Every day | ORAL | 2 refills | Status: DC

## 2022-05-25 MED ORDER — WEGOVY 2.4 MG/0.75ML ~~LOC~~ SOAJ: 2.4000 mg | SUBCUTANEOUS | 3 refills | Status: DC

## 2022-05-25 NOTE — Assessment & Plan Note (Signed)
Isabel Skinner restarted Microzide daily Continue with weight loss

## 2022-05-25 NOTE — Assessment & Plan Note (Signed)
Continue with weight loss on Wegovy

## 2022-05-25 NOTE — Assessment & Plan Note (Addendum)
Tristin lost >5% of her original weight (177 lbs to 161 lbs) on Wegovy. She is on a low carb diet plan. Obesity related conditions - hyperlipidemia, HTN

## 2022-05-25 NOTE — Progress Notes (Signed)
Subjective:  Patient ID: Isabel Skinner, female    DOB: 10-09-1965  Age: 56 y.o. MRN: [id number]  CC: Follow-up (DISCUSS WEGOVY)   HPI Isabel Skinner presents for obesity, hyperlipidemia, HTN  Outpatient Medications Prior to Visit  Medication Sig Dispense Refill   acetaminophen (TYLENOL) 325 MG tablet Take 650 mg by mouth as needed.     albuterol (VENTOLIN HFA) 108 (90 Base) MCG/ACT inhaler Inhale 2 puffs into the lungs every 4 (four) hours as needed for wheezing or shortness of breath. 18 g 5   Cholecalciferol (VITAMIN D3) 2000 units capsule Take 1 capsule (2,000 Units total) by mouth daily. 100 capsule 3   hydrochlorothiazide (MICROZIDE) 12.5 MG capsule Take 1 capsule (12.5 mg total) by mouth daily. 90 capsule 3   ibuprofen (ADVIL,MOTRIN) 600 MG tablet Take twice a day x 1 week, then prn pain 60 tablet 0   montelukast (SINGULAIR) 10 MG tablet Take 1 tablet (10 mg total) by mouth daily. 90 tablet 3   ondansetron (ZOFRAN) 4 MG tablet Take 1 tablet (4 mg total) by mouth every 8 (eight) hours as needed for nausea or vomiting. 20 tablet 0   cetirizine (EQ ALLERGY RELIEF, CETIRIZINE,) 10 MG tablet Take 1 tablet by mouth once daily (Patient not taking: Reported on 05/25/2022) 90 tablet 3   fluticasone (FLONASE) 50 MCG/ACT nasal spray Place into the nose. (Patient not taking: Reported on 05/25/2022)     phentermine (ADIPEX-P) 37.5 MG tablet Take 1 tablet (37.5 mg total) by mouth daily before breakfast. 30 tablet 2   Semaglutide-Weight Management (WEGOVY) 2.4 MG/0.75ML SOAJ Inject 2.4 mg into the skin once a week. (Patient not taking: Reported on 05/25/2022) 3 mL 3   No facility-administered medications prior to visit.    ROS: Review of Systems  Constitutional:  Negative for activity change, appetite change, chills, fatigue and unexpected weight change.  HENT:  Negative for congestion, mouth sores and sinus pressure.   Eyes:  Negative for visual disturbance.  Respiratory:  Negative for cough,  chest tightness and shortness of breath.   Cardiovascular:  Negative for chest pain, palpitations and leg swelling.  Gastrointestinal:  Negative for abdominal pain and nausea.  Genitourinary:  Negative for difficulty urinating, frequency and vaginal pain.  Musculoskeletal:  Negative for back pain and gait problem.  Skin:  Negative for pallor and rash.  Neurological:  Negative for dizziness, tremors, weakness, numbness and headaches.  Psychiatric/Behavioral:  Negative for confusion, dysphoric mood, sleep disturbance and suicidal ideas.     Objective:  BP 132/82 (BP Location: Left Arm)   Pulse 78   Temp 98.3 F (36.8 C) (Oral)   Ht '5\' 4"'$  (1.626 m)   Wt 161 lb 12.8 oz (73.4 kg)   LMP 10/03/2015 (Approximate)   SpO2 99%   BMI 27.77 kg/m   BP Readings from Last 3 Encounters:  05/25/22 132/82  03/31/22 132/86  12/13/21 140/90    Wt Readings from Last 3 Encounters:  05/25/22 161 lb 12.8 oz (73.4 kg)  03/31/22 172 lb 6.4 oz (78.2 kg)  12/13/21 170 lb (77.1 kg)    Physical Exam Constitutional:      General: She is not in acute distress.    Appearance: She is well-developed. She is obese.  HENT:     Head: Normocephalic.     Right Ear: External ear normal.     Left Ear: External ear normal.     Nose: Nose normal.  Eyes:     General:  Right eye: No discharge.        Left eye: No discharge.     Conjunctiva/sclera: Conjunctivae normal.     Pupils: Pupils are equal, round, and reactive to light.  Neck:     Thyroid: No thyromegaly.     Vascular: No JVD.     Trachea: No tracheal deviation.  Cardiovascular:     Rate and Rhythm: Normal rate and regular rhythm.     Heart sounds: Normal heart sounds.  Pulmonary:     Effort: No respiratory distress.     Breath sounds: No stridor. No wheezing.  Abdominal:     General: Bowel sounds are normal. There is no distension.     Palpations: Abdomen is soft. There is no mass.     Tenderness: There is no abdominal tenderness. There  is no guarding or rebound.  Musculoskeletal:        General: No tenderness.     Cervical back: Normal range of motion and neck supple. No rigidity.  Lymphadenopathy:     Cervical: No cervical adenopathy.  Skin:    Findings: No erythema or rash.  Neurological:     Cranial Nerves: No cranial nerve deficit.     Motor: No abnormal muscle tone.     Coordination: Coordination normal.     Deep Tendon Reflexes: Reflexes normal.  Psychiatric:        Behavior: Behavior normal.        Thought Content: Thought content normal.        Judgment: Judgment normal.   Letter dictated  Lab Results  Component Value Date   WBC 7.3 12/13/2021   HGB 12.2 12/13/2021   HCT 37.0 12/13/2021   PLT 323.0 12/13/2021   GLUCOSE 95 12/13/2021   CHOL 211 (H) 12/13/2021   TRIG 89.0 12/13/2021   HDL 67.30 12/13/2021   LDLCALC 126 (H) 12/13/2021   ALT 12 12/13/2021   AST 18 12/13/2021   NA 141 12/13/2021   K 4.1 12/13/2021   CL 105 12/13/2021   CREATININE 0.71 12/13/2021   BUN 9 12/13/2021   CO2 27 12/13/2021   TSH 1.38 12/13/2021   HGBA1C 5.8 05/25/2015    CT Abdomen Pelvis W Contrast  Result Date: 04/23/2022 CLINICAL DATA:  Left lower quadrant abdominal pain. EXAM: CT ABDOMEN AND PELVIS WITH CONTRAST TECHNIQUE: Multidetector CT imaging of the abdomen and pelvis was performed using the standard protocol following bolus administration of intravenous contrast. RADIATION DOSE REDUCTION: This exam was performed according to the departmental dose-optimization program which includes automated exposure control, adjustment of the mA and/or kV according to patient size and/or use of iterative reconstruction technique. CONTRAST:  153m ISOVUE-300 IOPAMIDOL (ISOVUE-300) INJECTION 61% COMPARISON:  Pelvic ultrasound 04/06/2022 FINDINGS: Lower chest: Clear lung bases. No pleural fluid Hepatobiliary: Mild decreased hepatic density typical of steatosis. No focal liver lesion. Gallbladder physiologically distended, no  calcified stone. No biliary dilatation. Pancreas: Unremarkable. No pancreatic ductal dilatation or surrounding inflammatory changes. Spleen: Normal in size without focal abnormality. Adrenals/Urinary Tract: Normal adrenal glands. No hydronephrosis. Homogeneous renal enhancement. There is early excretion of IV contrast in both renal collecting systems which limits assessment for renal calculi. No evidence of focal renal lesion. Decompressed urinary bladder, not well assessed. Stomach/Bowel: Normal appearance of the stomach. Administered enteric contrast reaches the distal small bowel. No small bowel obstruction, wall thickening or inflammation. Normal appendix visualized. Multifocal colonic diverticulosis, including the right colon, but no evidence of diverticulitis. No colonic wall thickening or pericolonic edema. No  visible colonic mass. Vascular/Lymphatic: Normal caliber abdominal aorta. Patent portal and splenic veins. No acute vascular findings. No suspicious abdominopelvic adenopathy. Reproductive: Heterogeneous uterus, recently assessed on pelvic ultrasound which demonstrated fibroid. The ovaries are tentatively visualized and quiescent. No adnexal mass. Other: No ascites. No abdominopelvic collection. No abdominal wall hernia. Musculoskeletal: Degenerative disc disease from L2-L3 through L4-L5. Lower lumbar facet hypertrophy. There are no acute or suspicious osseous abnormalities. Unremarkable CT appearance of the musculature without explanation for pain. IMPRESSION: 1. No acute abnormality or explanation for abdominal pain. 2. Colonic diverticulosis without diverticulitis. 3. Mild hepatic steatosis. Electronically Signed   By: Keith Rake M.D.   On: 04/23/2022 12:41    Assessment & Plan:   Problem List Items Addressed This Visit     Obesity (BMI 30-39.9) - Primary    Lyvonne lost >5% of her original weight (177 lbs to 161 lbs) on Wegovy. She is on a low carb diet plan. Obesity related conditions  - hyperlipidemia, HTN       Relevant Medications   phentermine (ADIPEX-P) 37.5 MG tablet   Semaglutide-Weight Management (WEGOVY) 2.4 MG/0.75ML SOAJ   Hypercholesterolemia    Continue with weight loss on Wegovy      HTN (hypertension)    Izora Gala restarted Microzide daily Continue with weight loss         Meds ordered this encounter  Medications   phentermine (ADIPEX-P) 37.5 MG tablet    Sig: Take 1 tablet (37.5 mg total) by mouth daily before breakfast.    Dispense:  30 tablet    Refill:  2   Semaglutide-Weight Management (WEGOVY) 2.4 MG/0.75ML SOAJ    Sig: Inject 2.4 mg into the skin once a week.    Dispense:  3 mL    Refill:  3    Janine Limbo lost >5% of her original weight on Wegovy. She is on a low carb diet plan. Her obesity related condition is hyperlipidemia: weight loss helped to improve it.      Follow-up: Return in about 3 months (around 08/25/2022) for a follow-up visit.  Walker Kehr, MD

## 2022-05-30 ENCOUNTER — Telehealth: Payer: Self-pay | Admitting: *Deleted

## 2022-05-30 NOTE — Telephone Encounter (Signed)
Pt need PA on Wegovy. Submitted w/ (Key: [id number]) it states data has been sent to [facility] successfully.Marland KitchenJohny Skinner

## 2022-05-30 NOTE — Telephone Encounter (Signed)
Rec'd msg back stating "  Your PA request has been closed. WEGOVY 2.'4MG'$  INJ #9/09 days // Duplicate request cannot be processed. Please refer to latest denial letter for appeal information. - HH, 05/30/2022 02:39 PM. Faxing Appeal letter to [facility] [phone]...Johny Chess

## 2022-08-25 ENCOUNTER — Ambulatory Visit (INDEPENDENT_AMBULATORY_CARE_PROVIDER_SITE_OTHER): Payer: BC Managed Care – PPO | Admitting: Internal Medicine

## 2022-08-25 ENCOUNTER — Encounter: Payer: Self-pay | Admitting: Internal Medicine

## 2022-08-25 ENCOUNTER — Other Ambulatory Visit (HOSPITAL_COMMUNITY): Payer: Self-pay

## 2022-08-25 VITALS — BP 130/92 | HR 99 | Temp 97.9°F | Ht 64.0 in | Wt 164.0 lb

## 2022-08-25 DIAGNOSIS — E669 Obesity, unspecified: Secondary | ICD-10-CM | POA: Diagnosis not present

## 2022-08-25 DIAGNOSIS — Z0000 Encounter for general adult medical examination without abnormal findings: Secondary | ICD-10-CM | POA: Diagnosis not present

## 2022-08-25 DIAGNOSIS — J45909 Unspecified asthma, uncomplicated: Secondary | ICD-10-CM | POA: Insufficient documentation

## 2022-08-25 DIAGNOSIS — J4521 Mild intermittent asthma with (acute) exacerbation: Secondary | ICD-10-CM | POA: Diagnosis not present

## 2022-08-25 DIAGNOSIS — J069 Acute upper respiratory infection, unspecified: Secondary | ICD-10-CM | POA: Diagnosis not present

## 2022-08-25 DIAGNOSIS — N959 Unspecified menopausal and perimenopausal disorder: Secondary | ICD-10-CM

## 2022-08-25 DIAGNOSIS — R739 Hyperglycemia, unspecified: Secondary | ICD-10-CM

## 2022-08-25 MED ORDER — CEFDINIR 300 MG PO CAPS: 300.0000 mg | ORAL_CAPSULE | Freq: Two times a day (BID) | ORAL | 0 refills | Status: DC

## 2022-08-25 MED ORDER — CETIRIZINE HCL 10 MG PO TABS: 10.0000 mg | ORAL_TABLET | Freq: Every day | ORAL | 3 refills | Status: DC

## 2022-08-25 MED ORDER — MONTELUKAST SODIUM 10 MG PO TABS: 10.0000 mg | ORAL_TABLET | Freq: Every day | ORAL | 3 refills | Status: DC

## 2022-08-25 MED ORDER — HYDROCHLOROTHIAZIDE 12.5 MG PO CAPS: 12.5000 mg | ORAL_CAPSULE | Freq: Every day | ORAL | 3 refills | Status: AC

## 2022-08-25 MED ORDER — FLUTICASONE PROPIONATE 50 MCG/ACT NA SUSP: 2.0000 | Freq: Every day | NASAL | 5 refills | Status: AC

## 2022-08-25 MED ORDER — PHENTERMINE HCL 37.5 MG PO TABS: 37.5000 mg | ORAL_TABLET | Freq: Every day | ORAL | 2 refills | Status: DC

## 2022-08-25 MED ORDER — PROMETHAZINE-DM 6.25-15 MG/5ML PO SYRP: 5.0000 mL | ORAL_SOLUTION | Freq: Four times a day (QID) | ORAL | 0 refills | Status: DC | PRN

## 2022-08-25 MED ORDER — WEGOVY 0.25 MG/0.5ML ~~LOC~~ SOAJ
0.2500 mg | SUBCUTANEOUS | 2 refills | Status: DC
  Filled 2022-08-25 – 2022-09-09 (×2): qty 2, 28d supply, fill #0
  Filled 2022-10-07: qty 2, 28d supply, fill #1

## 2022-08-25 MED ORDER — AIRSUPRA 90-80 MCG/ACT IN AERO: 2.0000 | INHALATION_SPRAY | RESPIRATORY_TRACT | 5 refills | Status: AC | PRN

## 2022-08-25 NOTE — Assessment & Plan Note (Addendum)
Related to URI, new.   Airsupra MDI Prom DM syr

## 2022-08-25 NOTE — Assessment & Plan Note (Addendum)
New.  Treat with [facility]

## 2022-08-25 NOTE — Assessment & Plan Note (Signed)
BMI 30 in 2023 Added Phentermine 37.5 for added benefit  Potential benefits of  phentermine use as well as potential risks  and complications were explained to the patient and were aknowledged.  Isabel Skinner lost >5% of her original weight (177 lbs to 161 lbs) on Wegovy. She is on a low carb diet plan. Obesity related conditions - hyperlipidemia, HTN, asthma

## 2022-08-25 NOTE — Progress Notes (Signed)
Subjective:  Patient ID: Isabel Skinner, female    DOB: 01/30/1966  Age: 57 y.o. MRN: [id number]  CC: No chief complaint on file.   HPI Isabel Skinner presents for a well exam  C/o cough at night x 3 months after a URI H/o asthma Coughing up green sputum  Outpatient Medications Prior to Visit  Medication Sig Dispense Refill   hydrochlorothiazide (MICROZIDE) 12.5 MG capsule Take 1 capsule (12.5 mg total) by mouth daily. 90 capsule 3   montelukast (SINGULAIR) 10 MG tablet Take 1 tablet (10 mg total) by mouth daily. 90 tablet 3   acetaminophen (TYLENOL) 325 MG tablet Take 650 mg by mouth as needed.     albuterol (VENTOLIN HFA) 108 (90 Base) MCG/ACT inhaler Inhale 2 puffs into the lungs every 4 (four) hours as needed for wheezing or shortness of breath. 18 g 5   Cholecalciferol (VITAMIN D3) 2000 units capsule Take 1 capsule (2,000 Units total) by mouth daily. 100 capsule 3   ibuprofen (ADVIL,MOTRIN) 600 MG tablet Take twice a day x 1 week, then prn pain (Patient not taking: Reported on 08/25/2022) 60 tablet 0   cetirizine (EQ ALLERGY RELIEF, CETIRIZINE,) 10 MG tablet Take 1 tablet by mouth once daily 90 tablet 3   fluticasone (FLONASE) 50 MCG/ACT nasal spray Place into the nose. (Patient not taking: Reported on 05/25/2022)     ondansetron (ZOFRAN) 4 MG tablet Take 1 tablet (4 mg total) by mouth every 8 (eight) hours as needed for nausea or vomiting. 20 tablet 0   phentermine (ADIPEX-P) 37.5 MG tablet Take 1 tablet (37.5 mg total) by mouth daily before breakfast. 30 tablet 2   Semaglutide-Weight Management (WEGOVY) 2.4 MG/0.75ML SOAJ Inject 2.4 mg into the skin once a week. 3 mL 3   No facility-administered medications prior to visit.    ROS: Review of Systems  Constitutional:  Positive for unexpected weight change. Negative for activity change, appetite change, chills and fatigue.  HENT:  Positive for postnasal drip and rhinorrhea. Negative for congestion, mouth sores and sinus pressure.    Eyes:  Negative for visual disturbance.  Respiratory:  Positive for cough. Negative for chest tightness.   Gastrointestinal:  Negative for abdominal pain and nausea.  Genitourinary:  Negative for difficulty urinating, frequency and vaginal pain.  Musculoskeletal:  Negative for back pain and gait problem.  Skin:  Negative for pallor and rash.  Neurological:  Negative for dizziness, tremors, weakness, numbness and headaches.  Psychiatric/Behavioral:  Negative for confusion and sleep disturbance.     Objective:  BP (!) 130/92 (BP Location: Left Arm, Patient Position: Sitting, Cuff Size: Normal)   Pulse 99   Temp 97.9 F (36.6 C) (Oral)   Ht '5\' 4"'$  (1.626 m)   Wt 164 lb (74.4 kg)   LMP 10/03/2015 (Approximate)   SpO2 100%   BMI 28.15 kg/m   BP Readings from Last 3 Encounters:  08/25/22 (!) 130/92  05/25/22 132/82  03/31/22 132/86    Wt Readings from Last 3 Encounters:  08/25/22 164 lb (74.4 kg)  05/25/22 161 lb 12.8 oz (73.4 kg)  03/31/22 172 lb 6.4 oz (78.2 kg)    Physical Exam Constitutional:      General: She is not in acute distress.    Appearance: She is well-developed.  HENT:     Head: Normocephalic.     Right Ear: External ear normal.     Left Ear: External ear normal.     Nose: Nose normal.  Eyes:     General:        Right eye: No discharge.        Left eye: No discharge.     Conjunctiva/sclera: Conjunctivae normal.     Pupils: Pupils are equal, round, and reactive to light.  Neck:     Thyroid: No thyromegaly.     Vascular: No JVD.     Trachea: No tracheal deviation.  Cardiovascular:     Rate and Rhythm: Normal rate and regular rhythm.     Heart sounds: Normal heart sounds.  Pulmonary:     Effort: No respiratory distress.     Breath sounds: No stridor. No wheezing.  Abdominal:     General: Bowel sounds are normal. There is no distension.     Palpations: Abdomen is soft. There is no mass.     Tenderness: There is no abdominal tenderness. There is no  guarding or rebound.  Musculoskeletal:        General: No tenderness.     Cervical back: Normal range of motion and neck supple. No rigidity.  Lymphadenopathy:     Cervical: No cervical adenopathy.  Skin:    Findings: No erythema or rash.  Neurological:     Cranial Nerves: No cranial nerve deficit.     Motor: No abnormal muscle tone.     Coordination: Coordination normal.     Deep Tendon Reflexes: Reflexes normal.  Psychiatric:        Behavior: Behavior normal.        Thought Content: Thought content normal.        Judgment: Judgment normal.     Lab Results  Component Value Date   WBC 7.3 12/13/2021   HGB 12.2 12/13/2021   HCT 37.0 12/13/2021   PLT 323.0 12/13/2021   GLUCOSE 95 12/13/2021   CHOL 211 (H) 12/13/2021   TRIG 89.0 12/13/2021   HDL 67.30 12/13/2021   LDLCALC 126 (H) 12/13/2021   ALT 12 12/13/2021   AST 18 12/13/2021   NA 141 12/13/2021   K 4.1 12/13/2021   CL 105 12/13/2021   CREATININE 0.71 12/13/2021   BUN 9 12/13/2021   CO2 27 12/13/2021   TSH 1.38 12/13/2021   HGBA1C 5.8 05/25/2015    CT Abdomen Pelvis W Contrast  Result Date: 04/23/2022 CLINICAL DATA:  Left lower quadrant abdominal pain. EXAM: CT ABDOMEN AND PELVIS WITH CONTRAST TECHNIQUE: Multidetector CT imaging of the abdomen and pelvis was performed using the standard protocol following bolus administration of intravenous contrast. RADIATION DOSE REDUCTION: This exam was performed according to the departmental dose-optimization program which includes automated exposure control, adjustment of the mA and/or kV according to patient size and/or use of iterative reconstruction technique. CONTRAST:  146m ISOVUE-300 IOPAMIDOL (ISOVUE-300) INJECTION 61% COMPARISON:  Pelvic ultrasound 04/06/2022 FINDINGS: Lower chest: Clear lung bases. No pleural fluid Hepatobiliary: Mild decreased hepatic density typical of steatosis. No focal liver lesion. Gallbladder physiologically distended, no calcified stone. No  biliary dilatation. Pancreas: Unremarkable. No pancreatic ductal dilatation or surrounding inflammatory changes. Spleen: Normal in size without focal abnormality. Adrenals/Urinary Tract: Normal adrenal glands. No hydronephrosis. Homogeneous renal enhancement. There is early excretion of IV contrast in both renal collecting systems which limits assessment for renal calculi. No evidence of focal renal lesion. Decompressed urinary bladder, not well assessed. Stomach/Bowel: Normal appearance of the stomach. Administered enteric contrast reaches the distal small bowel. No small bowel obstruction, wall thickening or inflammation. Normal appendix visualized. Multifocal colonic diverticulosis, including the right colon, but  no evidence of diverticulitis. No colonic wall thickening or pericolonic edema. No visible colonic mass. Vascular/Lymphatic: Normal caliber abdominal aorta. Patent portal and splenic veins. No acute vascular findings. No suspicious abdominopelvic adenopathy. Reproductive: Heterogeneous uterus, recently assessed on pelvic ultrasound which demonstrated fibroid. The ovaries are tentatively visualized and quiescent. No adnexal mass. Other: No ascites. No abdominopelvic collection. No abdominal wall hernia. Musculoskeletal: Degenerative disc disease from L2-L3 through L4-L5. Lower lumbar facet hypertrophy. There are no acute or suspicious osseous abnormalities. Unremarkable CT appearance of the musculature without explanation for pain. IMPRESSION: 1. No acute abnormality or explanation for abdominal pain. 2. Colonic diverticulosis without diverticulitis. 3. Mild hepatic steatosis. Electronically Signed   By: Keith Rake M.D.   On: 04/23/2022 12:41    Assessment & Plan:   Problem List Items Addressed This Visit       Respiratory   Asthmatic bronchitis    Related to URI, new.   Airsupra MDI Prom DM syr      Relevant Medications   montelukast (SINGULAIR) 10 MG tablet   Albuterol-Budesonide  (AIRSUPRA) 90-80 MCG/ACT AERO   Acute upper respiratory infection    New.  Treat with Omnicef      Relevant Medications   cefdinir (OMNICEF) 300 MG capsule     Other   Well adult exam - Primary   Relevant Orders   TSH   Urinalysis   CBC with Differential/Platelet   Lipid panel   Comprehensive metabolic panel   Obesity (BMI 30-39.9)     BMI 30 in 2023 Added Phentermine 37.5 for added benefit  Potential benefits of  phentermine use as well as potential risks  and complications were explained to the patient and were aknowledged.  Siera lost >5% of her original weight (177 lbs to 161 lbs) on Wegovy. She is on a low carb diet plan. Obesity related conditions - hyperlipidemia, HTN, asthma      Relevant Medications   Semaglutide-Weight Management (WEGOVY) 0.25 MG/0.5ML SOAJ   phentermine (ADIPEX-P) 37.5 MG tablet   Other Visit Diagnoses     Menopausal and perimenopausal disorder       Relevant Orders   Ambulatory referral to Gynecology   Hyperglycemia       Relevant Orders   Hemoglobin A1c         Meds ordered this encounter  Medications   Semaglutide-Weight Management (WEGOVY) 0.25 MG/0.5ML SOAJ    Sig: Inject 0.25 mg into the skin once a week.    Dispense:  2 mL    Refill:  2    BMI 30.  She is on a low carb diet plan. Obesity related conditions - hyperlipidemia, HTN, asthma   cetirizine (EQ ALLERGY RELIEF, CETIRIZINE,) 10 MG tablet    Sig: Take 1 tablet (10 mg total) by mouth daily.    Dispense:  90 tablet    Refill:  3   fluticasone (FLONASE) 50 MCG/ACT nasal spray    Sig: Place 2 sprays into both nostrils daily.    Dispense:  16 g    Refill:  5   hydrochlorothiazide (MICROZIDE) 12.5 MG capsule    Sig: Take 1 capsule (12.5 mg total) by mouth daily.    Dispense:  90 capsule    Refill:  3   montelukast (SINGULAIR) 10 MG tablet    Sig: Take 1 tablet (10 mg total) by mouth daily.    Dispense:  90 tablet    Refill:  3   phentermine (ADIPEX-P) 37.5 MG tablet  Sig: Take 1 tablet (37.5 mg total) by mouth daily before breakfast.    Dispense:  30 tablet    Refill:  2   Albuterol-Budesonide (AIRSUPRA) 90-80 MCG/ACT AERO    Sig: Inhale 2 Inhalations into the lungs every 4 (four) hours as needed.    Dispense:  10 g    Refill:  5   DISCONTD: cefdinir (OMNICEF) 300 MG capsule    Sig: Take 1 capsule (300 mg total) by mouth 2 (two) times daily.    Dispense:  20 capsule    Refill:  0   promethazine-dextromethorphan (PROMETHAZINE-DM) 6.25-15 MG/5ML syrup    Sig: Take 5 mLs by mouth 4 (four) times daily as needed for cough.    Dispense:  240 mL    Refill:  0   cefdinir (OMNICEF) 300 MG capsule    Sig: Take 1 capsule (300 mg total) by mouth 2 (two) times daily.    Dispense:  20 capsule    Refill:  0      Follow-up: Return in about 3 months (around 11/23/2022) for a follow-up visit.  Walker Kehr, MD

## 2022-08-31 ENCOUNTER — Other Ambulatory Visit (HOSPITAL_COMMUNITY): Payer: Self-pay

## 2022-09-01 ENCOUNTER — Other Ambulatory Visit (HOSPITAL_COMMUNITY): Payer: Self-pay

## 2022-09-08 ENCOUNTER — Telehealth: Payer: Self-pay | Admitting: *Deleted

## 2022-09-08 ENCOUNTER — Other Ambulatory Visit (INDEPENDENT_AMBULATORY_CARE_PROVIDER_SITE_OTHER): Payer: BC Managed Care – PPO

## 2022-09-08 DIAGNOSIS — R739 Hyperglycemia, unspecified: Secondary | ICD-10-CM

## 2022-09-08 DIAGNOSIS — Z0000 Encounter for general adult medical examination without abnormal findings: Secondary | ICD-10-CM | POA: Diagnosis not present

## 2022-09-08 LAB — COMPREHENSIVE METABOLIC PANEL
ALT: 11 U/L (ref 0–35)
AST: 18 U/L (ref 0–37)
Albumin: 4.5 g/dL (ref 3.5–5.2)
Alkaline Phosphatase: 70 U/L (ref 39–117)
BUN: 12 mg/dL (ref 6–23)
CO2: 27 mEq/L (ref 19–32)
Calcium: 10.1 mg/dL (ref 8.4–10.5)
Chloride: 100 mEq/L (ref 96–112)
Creatinine, Ser: 0.89 mg/dL (ref 0.40–1.20)
GFR: 71.68 mL/min (ref >60.00)
Glucose, Bld: 91 mg/dL (ref 70–99)
Potassium: 3.9 mEq/L (ref 3.5–5.1)
Sodium: 138 mEq/L (ref 135–145)
Total Bilirubin: 0.4 mg/dL (ref 0.2–1.2)
Total Protein: 7.8 g/dL (ref 6.0–8.3)

## 2022-09-08 LAB — LIPID PANEL
Cholesterol: 234 mg/dL — ABNORMAL HIGH (ref 0–200)
HDL: 72.3 mg/dL (ref >39.00)
LDL Cholesterol: 148 mg/dL — ABNORMAL HIGH (ref 0–99)
NonHDL: 161.36
Total CHOL/HDL Ratio: 3
Triglycerides: 68 mg/dL (ref 0.0–149.0)
VLDL: 13.6 mg/dL (ref 0.0–40.0)

## 2022-09-08 LAB — CBC WITH DIFFERENTIAL/PLATELET
Basophils Absolute: 0 10*3/uL (ref 0.0–0.1)
Basophils Relative: 0.4 % (ref 0.0–3.0)
Eosinophils Absolute: 0.1 10*3/uL (ref 0.0–0.7)
Eosinophils Relative: 1.8 % (ref 0.0–5.0)
HCT: 41.6 % (ref 36.0–46.0)
Hemoglobin: 13.7 g/dL (ref 12.0–15.0)
Lymphocytes Relative: 34.7 % (ref 12.0–46.0)
Lymphs Abs: 2.1 10*3/uL (ref 0.7–4.0)
MCHC: 32.9 g/dL (ref 30.0–36.0)
MCV: 85.2 fl (ref 78.0–100.0)
Monocytes Absolute: 0.5 10*3/uL (ref 0.1–1.0)
Monocytes Relative: 7.6 % (ref 3.0–12.0)
Neutro Abs: 3.3 10*3/uL (ref 1.4–7.7)
Neutrophils Relative %: 55.5 % (ref 43.0–77.0)
Platelets: 412 10*3/uL — ABNORMAL HIGH (ref 150.0–400.0)
RBC: 4.88 Mil/uL (ref 3.87–5.11)
RDW: 13.8 % (ref 11.5–15.5)
WBC: 6 10*3/uL (ref 4.0–10.5)

## 2022-09-08 LAB — URINALYSIS, ROUTINE W REFLEX MICROSCOPIC
Bilirubin Urine: NEGATIVE
Hgb urine dipstick: NEGATIVE
Ketones, ur: NEGATIVE
Nitrite: NEGATIVE
RBC / HPF: NONE SEEN (ref 0–?)
Specific Gravity, Urine: 1.01 (ref 1.000–1.030)
Total Protein, Urine: NEGATIVE
Urine Glucose: NEGATIVE
Urobilinogen, UA: 0.2 (ref 0.0–1.0)
pH: 7.5 (ref 5.0–8.0)

## 2022-09-08 LAB — HM MAMMOGRAPHY

## 2022-09-08 LAB — HEMOGLOBIN A1C: Hgb A1c MFr Bld: 6.3 % (ref 4.6–6.5)

## 2022-09-08 LAB — TSH: TSH: 1.42 u[IU]/mL (ref 0.35–5.50)

## 2022-09-08 NOTE — Telephone Encounter (Signed)
Pt need PA on Welgovy. Submitted w/ (Key: [id number]) Your PA request has been sent to [facility].Marland KitchenJohny Chess

## 2022-09-09 ENCOUNTER — Encounter: Payer: Self-pay | Admitting: Internal Medicine

## 2022-09-09 ENCOUNTER — Other Ambulatory Visit: Payer: Self-pay

## 2022-09-09 ENCOUNTER — Other Ambulatory Visit (HOSPITAL_COMMUNITY): Payer: Self-pay

## 2022-09-09 NOTE — Telephone Encounter (Signed)
Rec'd determination med was APPROVED. Effective 09/08/2022 - 04/10/2023. Approval sent to POF and patient../l;mb

## 2022-09-12 ENCOUNTER — Encounter: Payer: Self-pay | Admitting: Internal Medicine

## 2022-10-07 ENCOUNTER — Other Ambulatory Visit (HOSPITAL_COMMUNITY): Payer: Self-pay

## 2022-12-20 ENCOUNTER — Other Ambulatory Visit (HOSPITAL_COMMUNITY): Payer: Self-pay

## 2022-12-20 MED ORDER — WEGOVY 0.25 MG/0.5ML ~~LOC~~ SOAJ
0.2500 mg | SUBCUTANEOUS | 0 refills | Status: DC
  Filled 2022-12-20: qty 2, 28d supply, fill #0

## 2022-12-21 ENCOUNTER — Other Ambulatory Visit (HOSPITAL_COMMUNITY): Payer: Self-pay

## 2022-12-28 ENCOUNTER — Other Ambulatory Visit (HOSPITAL_COMMUNITY): Payer: Self-pay

## 2023-01-12 ENCOUNTER — Encounter: Payer: Self-pay | Admitting: Internal Medicine

## 2023-01-16 ENCOUNTER — Encounter: Payer: Self-pay | Admitting: Internal Medicine

## 2023-02-06 ENCOUNTER — Ambulatory Visit (INDEPENDENT_AMBULATORY_CARE_PROVIDER_SITE_OTHER): Payer: BC Managed Care – PPO | Admitting: Internal Medicine

## 2023-02-06 ENCOUNTER — Encounter: Payer: Self-pay | Admitting: Internal Medicine

## 2023-02-06 VITALS — BP 110/80 | HR 80 | Temp 98.0°F | Ht 64.0 in | Wt 159.0 lb

## 2023-02-06 DIAGNOSIS — Z0000 Encounter for general adult medical examination without abnormal findings: Secondary | ICD-10-CM

## 2023-02-06 DIAGNOSIS — E559 Vitamin D deficiency, unspecified: Secondary | ICD-10-CM | POA: Diagnosis not present

## 2023-02-06 DIAGNOSIS — Z809 Family history of malignant neoplasm, unspecified: Secondary | ICD-10-CM

## 2023-02-06 DIAGNOSIS — E785 Hyperlipidemia, unspecified: Secondary | ICD-10-CM | POA: Diagnosis not present

## 2023-02-06 DIAGNOSIS — Z6827 Body mass index (BMI) 27.0-27.9, adult: Secondary | ICD-10-CM

## 2023-02-06 DIAGNOSIS — E7800 Pure hypercholesterolemia, unspecified: Secondary | ICD-10-CM

## 2023-02-06 DIAGNOSIS — I10 Essential (primary) hypertension: Secondary | ICD-10-CM

## 2023-02-06 DIAGNOSIS — E669 Obesity, unspecified: Secondary | ICD-10-CM

## 2023-02-06 NOTE — Patient Instructions (Signed)
[email or web address]

## 2023-02-06 NOTE — Assessment & Plan Note (Signed)
On Semaglutide

## 2023-02-06 NOTE — Assessment & Plan Note (Signed)
BP Readings from Last 3 Encounters:  02/06/23 110/80  08/25/22 (!) 130/92  05/25/22 132/82

## 2023-02-06 NOTE — Assessment & Plan Note (Signed)
On Vit D 

## 2023-02-06 NOTE — Progress Notes (Signed)
Subjective:  Patient ID: Isabel Skinner, female    DOB: 07-Aug-1965  Age: 57 y.o. MRN: [id number]  CC: Follow-up   HPI Isabel Skinner presents for obesity, stress, fam h/o cancer  Father was just dx'd w/pancreatic cancer stage 4 at 60.  Outpatient Medications Prior to Visit  Medication Sig Dispense Refill   acetaminophen (TYLENOL) 325 MG tablet Take 650 mg by mouth as needed.     albuterol (VENTOLIN HFA) 108 (90 Base) MCG/ACT inhaler Inhale 2 puffs into the lungs every 4 (four) hours as needed for wheezing or shortness of breath. 18 g 5   Albuterol-Budesonide (AIRSUPRA) 90-80 MCG/ACT AERO Inhale 2 Inhalations into the lungs every 4 (four) hours as needed. 10 g 5   cefdinir (OMNICEF) 300 MG capsule Take 1 capsule (300 mg total) by mouth 2 (two) times daily. 20 capsule 0   cetirizine (EQ ALLERGY RELIEF, CETIRIZINE,) 10 MG tablet Take 1 tablet (10 mg total) by mouth daily. 90 tablet 3   Cholecalciferol (VITAMIN D3) 2000 units capsule Take 1 capsule (2,000 Units total) by mouth daily. 100 capsule 3   fluticasone (FLONASE) 50 MCG/ACT nasal spray Place 2 sprays into both nostrils daily. 16 g 5   hydrochlorothiazide (MICROZIDE) 12.5 MG capsule Take 1 capsule (12.5 mg total) by mouth daily. 90 capsule 3   montelukast (SINGULAIR) 10 MG tablet Take 1 tablet (10 mg total) by mouth daily. 90 tablet 3   phentermine (ADIPEX-P) 37.5 MG tablet Take 1 tablet (37.5 mg total) by mouth daily before breakfast. 30 tablet 2   promethazine-dextromethorphan (PROMETHAZINE-DM) 6.25-15 MG/5ML syrup Take 5 mLs by mouth 4 (four) times daily as needed for cough. 240 mL 0   Semaglutide-Weight Management (WEGOVY) 0.25 MG/0.5ML SOAJ Inject 0.25 mg into the skin once a week. 2 mL 2   Semaglutide-Weight Management (WEGOVY) 0.25 MG/0.5ML SOAJ Inject 0.25 mg into the skin once a week. 2 mL 0   ibuprofen (ADVIL,MOTRIN) 600 MG tablet Take twice a day x 1 week, then prn pain (Patient not taking: Reported on 08/25/2022) 60 tablet 0    No facility-administered medications prior to visit.    ROS: Review of Systems  Constitutional:  Negative for activity change, appetite change, chills, fatigue and unexpected weight change.  HENT:  Negative for congestion, mouth sores and sinus pressure.   Eyes:  Negative for visual disturbance.  Respiratory:  Negative for cough and chest tightness.   Gastrointestinal:  Negative for abdominal pain and nausea.  Genitourinary:  Negative for difficulty urinating, frequency and vaginal pain.  Musculoskeletal:  Negative for back pain and gait problem.  Skin:  Negative for pallor and rash.  Neurological:  Negative for dizziness, tremors, weakness, numbness and headaches.  Psychiatric/Behavioral:  Negative for confusion and sleep disturbance.     Objective:  BP 110/80 (BP Location: Left Arm, Patient Position: Sitting, Cuff Size: Large)   Pulse 80   Temp 98 F (36.7 C) (Oral)   Ht 5\' 4"  (1.626 m)   Wt 159 lb (72.1 kg)   LMP 10/03/2015 (Approximate)   SpO2 96%   BMI 27.29 kg/m   BP Readings from Last 3 Encounters:  02/06/23 110/80  08/25/22 (!) 130/92  05/25/22 132/82    Wt Readings from Last 3 Encounters:  02/06/23 159 lb (72.1 kg)  08/25/22 164 lb (74.4 kg)  05/25/22 161 lb 12.8 oz (73.4 kg)    Physical Exam Constitutional:      General: She is not in acute distress.  Appearance: She is well-developed.  HENT:     Head: Normocephalic.     Right Ear: External ear normal.     Left Ear: External ear normal.     Nose: Nose normal.  Eyes:     General:        Right eye: No discharge.        Left eye: No discharge.     Conjunctiva/sclera: Conjunctivae normal.     Pupils: Pupils are equal, round, and reactive to light.  Neck:     Thyroid: No thyromegaly.     Vascular: No JVD.     Trachea: No tracheal deviation.  Cardiovascular:     Rate and Rhythm: Normal rate and regular rhythm.     Heart sounds: Normal heart sounds.  Pulmonary:     Effort: No respiratory  distress.     Breath sounds: No stridor. No wheezing.  Abdominal:     General: Bowel sounds are normal. There is no distension.     Palpations: Abdomen is soft. There is no mass.     Tenderness: There is no abdominal tenderness. There is no guarding or rebound.  Musculoskeletal:        General: No tenderness.     Cervical back: Normal range of motion and neck supple. No rigidity.  Lymphadenopathy:     Cervical: No cervical adenopathy.  Skin:    Findings: No erythema or rash.  Neurological:     Cranial Nerves: No cranial nerve deficit.     Motor: No abnormal muscle tone.     Coordination: Coordination normal.     Deep Tendon Reflexes: Reflexes normal.  Psychiatric:        Behavior: Behavior normal.        Thought Content: Thought content normal.        Judgment: Judgment normal.     Lab Results  Component Value Date   WBC 6.0 09/08/2022   HGB 13.7 09/08/2022   HCT 41.6 09/08/2022   PLT 412.0 (H) 09/08/2022   GLUCOSE 91 09/08/2022   CHOL 234 (H) 09/08/2022   TRIG 68.0 09/08/2022   HDL 72.30 09/08/2022   LDLCALC 148 (H) 09/08/2022   ALT 11 09/08/2022   AST 18 09/08/2022   NA 138 09/08/2022   K 3.9 09/08/2022   CL 100 09/08/2022   CREATININE 0.89 09/08/2022   BUN 12 09/08/2022   CO2 27 09/08/2022   TSH 1.42 09/08/2022   HGBA1C 6.3 09/08/2022    CT Abdomen Pelvis W Contrast  Result Date: 04/23/2022 CLINICAL DATA:  Left lower quadrant abdominal pain. EXAM: CT ABDOMEN AND PELVIS WITH CONTRAST TECHNIQUE: Multidetector CT imaging of the abdomen and pelvis was performed using the standard protocol following bolus administration of intravenous contrast. RADIATION DOSE REDUCTION: This exam was performed according to the departmental dose-optimization program which includes automated exposure control, adjustment of the mA and/or kV according to patient size and/or use of iterative reconstruction technique. CONTRAST:  ISOVUE-300 IOPAMIDOL (ISOVUE-300) INJECTION 61%  COMPARISON:  Pelvic ultrasound 04/06/2022 FINDINGS: Lower chest: Clear lung bases. No pleural fluid Hepatobiliary: Mild decreased hepatic density typical of steatosis. No focal liver lesion. Gallbladder physiologically distended, no calcified stone. No biliary dilatation. Pancreas: Unremarkable. No pancreatic ductal dilatation or surrounding inflammatory changes. Spleen: Normal in size without focal abnormality. Adrenals/Urinary Tract: Normal adrenal glands. No hydronephrosis. Homogeneous renal enhancement. There is early excretion of IV contrast in both renal collecting systems which limits assessment for renal calculi. No evidence of focal renal lesion.  Decompressed urinary bladder, not well assessed. Stomach/Bowel: Normal appearance of the stomach. Administered enteric contrast reaches the distal small bowel. No small bowel obstruction, wall thickening or inflammation. Normal appendix visualized. Multifocal colonic diverticulosis, including the right colon, but no evidence of diverticulitis. No colonic wall thickening or pericolonic edema. No visible colonic mass. Vascular/Lymphatic: Normal caliber abdominal aorta. Patent portal and splenic veins. No acute vascular findings. No suspicious abdominopelvic adenopathy. Reproductive: Heterogeneous uterus, recently assessed on pelvic ultrasound which demonstrated fibroid. The ovaries are tentatively visualized and quiescent. No adnexal mass. Other: No ascites. No abdominopelvic collection. No abdominal wall hernia. Musculoskeletal: Degenerative disc disease from L2-L3 through L4-L5. Lower lumbar facet hypertrophy. There are no acute or suspicious osseous abnormalities. Unremarkable CT appearance of the musculature without explanation for pain. IMPRESSION: 1. No acute abnormality or explanation for abdominal pain. 2. Colonic diverticulosis without diverticulitis. 3. Mild hepatic steatosis. Electronically Signed   By: Narda Rutherford M.D.   On: 04/23/2022 12:41     Assessment & Plan:   Problem List Items Addressed This Visit     Family history of cancer    Patient BrCa1/BrCa2 negative  Mother/sister/niece history of breast Ca  Father - pancreatic st 4 at 41      Well adult exam    On Semaglutide      Vitamin D deficiency    On Vit D      Obesity (BMI 30-39.9)    On Semaglutide      Dyslipidemia    Ordered Coronary calcium CT      HTN (hypertension)    BP Readings from Last 3 Encounters:  02/06/23 110/80  08/25/22 (!) 130/92  05/25/22 132/82         Other Visit Diagnoses     Hypercholesterolemia    -  Primary   Relevant Orders   CT CARDIAC SCORING (SELF PAY ONLY)         No orders of the defined types were placed in this encounter.     Follow-up: Return in about 6 months (around 08/09/2023) for a follow-up visit.  Sonda Primes, MD

## 2023-02-06 NOTE — Assessment & Plan Note (Signed)
Ordered Coronary calcium CT

## 2023-02-06 NOTE — Assessment & Plan Note (Signed)
Patient BrCa1/BrCa2 negative  Mother/sister/niece history of breast Ca  Father - pancreatic st 4 at 78

## 2023-02-09 ENCOUNTER — Other Ambulatory Visit: Payer: Non-veteran care

## 2023-02-10 ENCOUNTER — Ambulatory Visit
Admission: RE | Admit: 2023-02-10 | Discharge: 2023-02-10 | Disposition: A | Discharge status: Home / Self Care | Payer: No Typology Code available for payment source | Source: Ambulatory Visit | Attending: Internal Medicine | Admitting: Internal Medicine

## 2023-02-10 ENCOUNTER — Other Ambulatory Visit: Payer: No Typology Code available for payment source

## 2023-02-10 DIAGNOSIS — E7800 Pure hypercholesterolemia, unspecified: Secondary | ICD-10-CM

## 2023-02-15 ENCOUNTER — Telehealth: Payer: Self-pay | Admitting: Internal Medicine

## 2023-02-15 DIAGNOSIS — Z1231 Encounter for screening mammogram for malignant neoplasm of breast: Secondary | ICD-10-CM

## 2023-02-15 NOTE — Telephone Encounter (Signed)
Okay.  Done.  Thanks 

## 2023-02-15 NOTE — Telephone Encounter (Signed)
Patient called and said she spoke with Dr. Posey Rea about needing a mammogram done. She called [facility] and they told her they needed the order from her provider. Patient would like to know if Dr. Posey Rea can send an order for a diagnostic mammogram to [facility]. Best callback is [phone].

## 2023-02-16 NOTE — Telephone Encounter (Signed)
Notified pt MD has placed a referral for [facility].Marland KitchenRaechel Chute

## 2023-03-02 ENCOUNTER — Ambulatory Visit (INDEPENDENT_AMBULATORY_CARE_PROVIDER_SITE_OTHER): Payer: BC Managed Care – PPO | Admitting: Radiology

## 2023-03-02 ENCOUNTER — Encounter: Payer: Self-pay | Admitting: Radiology

## 2023-03-02 ENCOUNTER — Other Ambulatory Visit (HOSPITAL_COMMUNITY)
Admission: RE | Admit: 2023-03-02 | Discharge: 2023-03-02 | Disposition: A | Discharge status: Home / Self Care | Payer: BC Managed Care – PPO | Source: Ambulatory Visit | Attending: Radiology | Admitting: Radiology

## 2023-03-02 VITALS — BP 138/86 | Ht 63.5 in | Wt 158.0 lb

## 2023-03-02 DIAGNOSIS — Z01419 Encounter for gynecological examination (general) (routine) without abnormal findings: Secondary | ICD-10-CM

## 2023-03-02 DIAGNOSIS — N958 Other specified menopausal and perimenopausal disorders: Secondary | ICD-10-CM

## 2023-03-02 DIAGNOSIS — N6321 Unspecified lump in the left breast, upper outer quadrant: Secondary | ICD-10-CM | POA: Diagnosis not present

## 2023-03-02 DIAGNOSIS — N951 Menopausal and female climacteric states: Secondary | ICD-10-CM | POA: Diagnosis not present

## 2023-03-02 MED ORDER — IMVEXXY MAINTENANCE PACK 10 MCG VA INST: 1.0000 | VAGINAL_INSERT | VAGINAL | 11 refills | Status: DC

## 2023-03-02 NOTE — Progress Notes (Signed)
   Isabel Skinner Dec 24, 1965 [id number]   History: Postmenopausal 57 y.o. presents for annual exam. C/o dyspareunia and vaginal dryness. Breast mass left, upper outer quad, dx mammo ordered by PCP. Father recently diagnosed with pancreatic cancer.   Gynecologic History Postmenopausal Last Pap: 2021. Results were: normal Last mammogram: 09/08/22. Results were: normal Last colonoscopy: 2020 repeat 10 years   Obstetric History OB History  Gravida Para Term Preterm AB Living  3 2     1 2   SAB IAB Ectopic Multiple Live Births  1       2    # Outcome Date GA Lbr Len/2nd Weight Sex Type Anes PTL Lv  3 SAB           2 Para     F Vag-Spont   LIV  1 Para     M Vag-Spont   LIV     The following portions of the patient's history were reviewed and updated as appropriate: allergies, current medications, past family history, past medical history, past social history, past surgical history, and problem list.  Review of Systems Pertinent items noted in HPI and remainder of comprehensive ROS otherwise negative.  Past medical history, past surgical history, family history and social history were all reviewed and documented in the EPIC chart.  Exam:  Vitals:   03/02/23 1123  BP: 138/86  Weight: 158 lb (71.7 kg)  Height: 5' 3.5" (1.613 m)   Body mass index is 27.55 kg/m.  General appearance:  Normal Thyroid:  Symmetrical, normal in size, without palpable masses or nodularity. Respiratory  Auscultation:  Clear without wheezing or rhonchi Cardiovascular  Auscultation:  Regular rate, without rubs, murmurs or gallops  Edema/varicosities:  Not grossly evident Abdominal  Soft,nontender, without masses, guarding or rebound.  Liver/spleen:  No organomegaly noted  Hernia:  None appreciated  Skin  Inspection:  Grossly normal Breasts: Examined lying and sitting.   Right: Without masses, retractions, nipple discharge or axillary adenopathy.   Left: 4cm fluctuant mass in the upper outer quadrant.  No skin or nipple changes. Genitourinary   Inguinal/mons:  Normal without inguinal adenopathy  External genitalia:  Normal appearing vulva with no masses, tenderness, or lesions  BUS/Urethra/Skene's glands:  Normal  Vagina:  Normal appearing with normal color and discharge, no lesions. Atrophy: moderate   Cervix:  Normal appearing without discharge or lesions  Uterus:  Normal in size, shape and contour.  Midline and mobile, nontender  Adnexa/parametria:     Rt: Normal in size, without masses or tenderness.   Lt: Normal in size, without masses or tenderness.  Anus and perineum: Normal    Raynelle Fanning, CMA present for exam  Assessment/Plan:   1. Well woman exam with routine gynecological exam - Cytology - PAP( [facility]) - Labs with PCP - Up to date on screenings  2. Genitourinary syndrome of menopause - Estradiol (IMVEXXY MAINTENANCE PACK) 10 MCG INST; Place 1 tablet vaginally 2 (two) times a week.  Dispense: 8 each; Refill: 11  3. Mass of upper outer quadrant of left breast Dx mammo ordered by PCP at [facility]    Discussed SBE, colonoscopy and pap screening as directed. Recommend of exercise weekly, including weight bearing exercise.Return in 1 year for annual or sooner prn.  Arlie Solomons B WHNP-BC, 12:05 PM 03/02/2023

## 2023-03-09 LAB — CYTOLOGY - PAP
Comment: NEGATIVE
Diagnosis: NEGATIVE
High risk HPV: NEGATIVE

## 2023-03-10 ENCOUNTER — Encounter: Payer: Self-pay | Admitting: Internal Medicine

## 2023-08-15 ENCOUNTER — Telehealth: Payer: Self-pay | Admitting: Internal Medicine

## 2023-08-15 NOTE — Telephone Encounter (Signed)
Copied from CRM [id number]. Topic: General - Other >> Aug 15, 2023  1:17 PM Larwance Sachs wrote: Reason for CRM: Patient need a medical clearance form completed for employment, stated no form was provided but will check with employer incase there is a specific  form needed.  ---  Will update note when we have received form TDW

## 2023-08-17 ENCOUNTER — Ambulatory Visit

## 2023-08-18 ENCOUNTER — Ambulatory Visit

## 2023-08-18 DIAGNOSIS — Z111 Encounter for screening for respiratory tuberculosis: Secondary | ICD-10-CM | POA: Diagnosis not present

## 2023-08-21 ENCOUNTER — Ambulatory Visit

## 2023-08-21 LAB — TB SKIN TEST
Induration: 0 mm
TB Skin Test: NEGATIVE

## 2023-08-21 NOTE — Telephone Encounter (Signed)
 Patient dropped form off and it was placed in Dr. Loren Racer box up front

## 2023-08-21 NOTE — Progress Notes (Signed)
 Tuberculin skin test applied to Left ventral forearm. Result was negative with 0 measuring induration, with some erythema

## 2023-08-22 ENCOUNTER — Telehealth: Payer: Self-pay

## 2023-08-22 NOTE — Telephone Encounter (Signed)
 Copied from CRM [id number]. Topic: General - Other >> Aug 22, 2023  2:55 PM Almira Coaster wrote: Reason for CRM: Patient is calling to follow up on a medical clearance form she dropped off at the office. She would like to know if it's completed.

## 2023-08-22 NOTE — Telephone Encounter (Signed)
 Form has been filled out and is awaiting MD to sign.

## 2023-08-25 NOTE — Telephone Encounter (Signed)
 Copied from CRM [id number]. Topic: Medical Record Request - Other >> Aug 25, 2023 10:02 AM Orinda Kenner C wrote: Reason for CRM: Patient is checking on health form is completed. Per CAL, the form is not done yet. Patient states this form is very important and is needed for her job. Patient is asking if another provider can sign this form? Per CAL, no other provider can filled out this form, only pcp. CAL will let nurse know, but forms can take up to 7-10 days to be completed. Please contact patient at [phone] when the form is done.

## 2023-08-28 NOTE — Telephone Encounter (Signed)
 Pts paperwork has been signed and is ready for pick up.

## 2023-09-14 ENCOUNTER — Encounter: Payer: Self-pay | Admitting: Internal Medicine

## 2023-09-19 ENCOUNTER — Other Ambulatory Visit: Payer: Self-pay | Admitting: Internal Medicine

## 2023-10-02 ENCOUNTER — Telehealth: Payer: Self-pay | Admitting: Internal Medicine

## 2023-10-02 LAB — HM MAMMOGRAPHY

## 2023-10-02 NOTE — Telephone Encounter (Signed)
 Attempted to reach out to [facility] and was too for them to still see patient.

## 2023-10-02 NOTE — Telephone Encounter (Signed)
 Copied from CRM [id number]. Topic: General - Other >> Sep 29, 2023  4:21 PM Aisha D wrote: Reason for CRM: Katie from [facility] is calling to get a verbal authorization for the patient. Florentina Addison stated that the patient has an ultrasound and bilateral mammogram on Monday 3/31 in the afternoon and needs to get the verbal authorization today.Florentina Addison stated that is call back number is [phone]. Florentina Addison stated that she will also fax over the documents again but she would need either the documents faxed back or the verbal order, if not received by Monday 3/31 they would have the cancel the services.

## 2023-12-12 ENCOUNTER — Telehealth: Payer: Self-pay | Admitting: Internal Medicine

## 2023-12-12 NOTE — Telephone Encounter (Unsigned)
 Copied from CRM [id number]. Topic: Clinical - Medication Refill >> Dec 12, 2023  4:42 PM Tiffany S wrote: Medication: phentermine  (ADIPEX-P ) 37.5 MG tablet [[id number]] DISCONTINUED  Has the patient contacted their pharmacy? Yes (Agent: If no, request that the patient contact the pharmacy for the refill. If patient does not wish to contact the pharmacy document the reason why and proceed with request.) (Agent: If yes, when and what did the pharmacy advise?)  This is the patient's preferred pharmacy:  [facility] [address], [place] - [address] [place] [address] Phone: [phone] Fax: [phone]   Is this the correct pharmacy for this prescription? Yes If no, delete pharmacy and type the correct one.   Has the prescription been filled recently? Yes  Is the patient out of the medication? Yes  Has the patient been seen for an appointment in the last year OR does the patient have an upcoming appointment? Yes  Can we respond through MyChart? Yes  Agent: Please be advised that Rx refills may take up to 3 business days. We ask that you follow-up with your pharmacy.

## 2023-12-12 NOTE — Telephone Encounter (Signed)
 See medication request below. This med is not showing on active medication list. Please advise or contact patient for follow up.         Copied from CRM [id number]. Topic: Clinical - Medication Refill >> Dec 12, 2023  4:42 PM Tiffany S wrote: Medication: phentermine  (ADIPEX-P ) 37.5 MG tablet [[id number]]  DISCONTINUED  Has the patient contacted their pharmacy? Yes (Agent: If no, request that the patient contact the pharmacy for the refill. If patient does not wish to contact the pharmacy document the reason why and proceed with request.) (Agent: If yes, when and what did the pharmacy advise?)  This is the patient's preferred pharmacy:  [facility] [address] [place] - [address] [place] [address] Phone: [phone] Fax: [phone]   Is this the correct pharmacy for this prescription? Yes If no, delete pharmacy and type the correct one.   Has the prescription been filled recently? Yes  Is the patient out of the medication? Yes  Has the patient been seen for an appointment in the last year OR does the patient have an upcoming appointment? Yes  Can we respond through MyChart? Yes  Agent: Please be advised that Rx refills may take up to 3 business days. We ask that you follow-up with your pharmacy.

## 2023-12-15 ENCOUNTER — Other Ambulatory Visit: Payer: Self-pay | Admitting: Internal Medicine

## 2023-12-15 MED ORDER — PHENTERMINE HCL 37.5 MG PO TABS: 37.5000 mg | ORAL_TABLET | Freq: Every day | ORAL | 2 refills | Status: DC

## 2023-12-15 NOTE — Addendum Note (Signed)
 Addended by: Maysin Carstens V on: 12/15/2023 07:39 AM   Modules accepted: Orders

## 2023-12-15 NOTE — Telephone Encounter (Signed)
 Medication has been sent into pts pharmacy... However, for further refills pt is to see PCP

## 2023-12-15 NOTE — Telephone Encounter (Signed)
 Okay.  Schedule well visit

## 2023-12-21 NOTE — Progress Notes (Cosign Needed Addendum)
 Patient received TB skin test on 08/18/2023 patient responded well.  Medical screening examination/treatment/procedure(s) were performed by non-physician practitioner and as supervising physician I was immediately available for consultation/collaboration.  I agree with above. Karlynn Noel, MD

## 2024-01-10 ENCOUNTER — Telehealth: Payer: Self-pay | Admitting: Internal Medicine

## 2024-01-10 NOTE — Telephone Encounter (Signed)
 Copied from CRM [id number]. Topic: Clinical - Medication Question >> Jan 10, 2024  9:41 AM Deaijah H wrote: Reason for CRM: Christy w/ [facility] called in due to patient being at 27.78 BMI. Would like to know if patient has any other condition? In order to take medication phentermine  (ADIPEX-P ) 37.5 MG tablet allows from 27-30 if there are other conditions. Please call [phone]

## 2024-01-11 NOTE — Telephone Encounter (Signed)
 Obesity, asthmatic bronchitis, hypertension Thanks

## 2024-01-12 NOTE — Telephone Encounter (Signed)
 Spoke with pharmacist and was able to inform her of providers dx reasons as follows Obesity, asthmatic bronchitis, hypertension Thanks

## 2024-03-07 ENCOUNTER — Encounter: Payer: Self-pay | Admitting: Radiology

## 2024-03-07 ENCOUNTER — Ambulatory Visit (INDEPENDENT_AMBULATORY_CARE_PROVIDER_SITE_OTHER): Admitting: Radiology

## 2024-03-07 VITALS — BP 136/84 | HR 86 | Ht 64.5 in | Wt 165.0 lb

## 2024-03-07 DIAGNOSIS — N958 Other specified menopausal and perimenopausal disorders: Secondary | ICD-10-CM | POA: Diagnosis not present

## 2024-03-07 DIAGNOSIS — Z01419 Encounter for gynecological examination (general) (routine) without abnormal findings: Secondary | ICD-10-CM | POA: Diagnosis not present

## 2024-03-07 DIAGNOSIS — Z1331 Encounter for screening for depression: Secondary | ICD-10-CM | POA: Diagnosis not present

## 2024-03-07 MED ORDER — IMVEXXY MAINTENANCE PACK 10 MCG VA INST: 1.0000 | VAGINAL_INSERT | VAGINAL | 11 refills | Status: AC

## 2024-03-07 NOTE — Patient Instructions (Signed)
 Preventive Care 58-59 Years Old, Female  Preventive care refers to lifestyle choices and visits with your health care provider that can promote health and wellness. Preventive care visits are also called wellness exams.  What can I expect for my preventive care visit?  Counseling  Your health care provider may ask you questions about your:  Medical history, including:  Past medical problems.  Family medical history.  Pregnancy history.  Current health, including:  Menstrual cycle.  Method of birth control.  Emotional well-being.  Home life and relationship well-being.  Sexual activity and sexual health.  Lifestyle, including:  Alcohol, nicotine or tobacco, and drug use.  Access to firearms.  Diet, exercise, and sleep habits.  Work and work Astronomer.  Sunscreen use.  Safety issues such as seatbelt and bike helmet use.  Physical exam  Your health care provider will check your:  Height and weight. These may be used to calculate your BMI (body mass index). BMI is a measurement that tells if you are at a healthy weight.  Waist circumference. This measures the distance around your waistline. This measurement also tells if you are at a healthy weight and may help predict your risk of certain diseases, such as type 2 diabetes and high blood pressure.  Heart rate and blood pressure.  Body temperature.  Skin for abnormal spots.  What immunizations do I need?    Vaccines are usually given at various ages, according to a schedule. Your health care provider will recommend vaccines for you based on your age, medical history, and lifestyle or other factors, such as travel or where you work.  What tests do I need?  Screening  Your health care provider may recommend screening tests for certain conditions. This may include:  Lipid and cholesterol levels.  Diabetes screening. This is done by checking your blood sugar (glucose) after you have not eaten for a while (fasting).  Pelvic exam and Pap test.  Hepatitis B test.  Hepatitis C  test.  HIV (human immunodeficiency virus) test.  STI (sexually transmitted infection) testing, if you are at risk.  Lung cancer screening.  Colorectal cancer screening.  Mammogram. Talk with your health care provider about when you should start having regular mammograms. This may depend on whether you have a family history of breast cancer.  BRCA-related cancer screening. This may be done if you have a family history of breast, ovarian, tubal, or peritoneal cancers.  Bone density scan. This is done to screen for osteoporosis.  Talk with your health care provider about your test results, treatment options, and if necessary, the need for more tests.  Follow these instructions at home:  Eating and drinking    Eat a diet that includes fresh fruits and vegetables, whole grains, lean protein, and low-fat dairy products.  Take vitamin and mineral supplements as recommended by your health care provider.  Do not drink alcohol if:  Your health care provider tells you not to drink.  You are pregnant, may be pregnant, or are planning to become pregnant.  If you drink alcohol:  Limit how much you have to 0-1 drink a day.  Know how much alcohol is in your drink. In the [place] one drink equals one 12 oz bottle of beer (355 mL), one 5 oz glass of wine (148 mL), or one 1 oz glass of hard liquor (44 mL).  Lifestyle  Brush your teeth every morning and night with fluoride toothpaste. Floss one time each day.  Exercise for at least  30 minutes 5 or more days each week.  Do not use any products that contain nicotine or tobacco. These products include cigarettes, chewing tobacco, and vaping devices, such as e-cigarettes. If you need help quitting, ask your health care provider.  Do not use drugs.  If you are sexually active, practice safe sex. Use a condom or other form of protection to prevent STIs.  If you do not wish to become pregnant, use a form of birth control. If you plan to become pregnant, see your health care provider for a  prepregnancy visit.  Take aspirin only as told by your health care provider. Make sure that you understand how much to take and what form to take. Work with your health care provider to find out whether it is safe and beneficial for you to take aspirin daily.  Find healthy ways to manage stress, such as:  Meditation, yoga, or listening to music.  Journaling.  Talking to a trusted person.  Spending time with friends and family.  Minimize exposure to UV radiation to reduce your risk of skin cancer.  Safety  Always wear your seat belt while driving or riding in a vehicle.  Do not drive:  If you have been drinking alcohol. Do not ride with someone who has been drinking.  When you are tired or distracted.  While texting.  If you have been using any mind-altering substances or drugs.  Wear a helmet and other protective equipment during sports activities.  If you have firearms in your house, make sure you follow all gun safety procedures.  Seek help if you have been physically or sexually abused.  What's next?  Visit your health care provider once a year for an annual wellness visit.  Ask your health care provider how often you should have your eyes and teeth checked.  Stay up to date on all vaccines.  This information is not intended to replace advice given to you by your health care provider. Make sure you discuss any questions you have with your health care provider.  Document Revised: 12/16/2020 Document Reviewed: 12/16/2020  Elsevier Patient Education  2024 [employer].

## 2024-03-07 NOTE — Progress Notes (Signed)
 Isabel Skinner 03/01/1966 [id number]   History: Postmenopausal 58 y.o. presents for annual exam. Ran out of Imvexxy ,  needs refill.  Gynecologic History Postmenopausal Last Pap: 2041. Results were: normal Last mammogram: 3/25. Results were: normal Last colonoscopy: 2020 repeat 10 years   Obstetric History OB History  Gravida Para Term Preterm AB Living  3 2   1 2   SAB IAB Ectopic Multiple Live Births  1    2    # Outcome Date GA Lbr Len/2nd Weight Sex Type Anes PTL Lv  3 SAB           2 Para     F Vag-Spont   LIV  1 Para     M Vag-Spont   LIV      03/07/2024   11:38 AM 08/25/2022    3:51 PM 03/31/2022    4:12 PM 08/19/2021    4:31 PM 10/30/2017    3:35 PM  Depression screen PHQ 2/9  Decreased Interest 0 0 0 0 0  Down, Depressed, Hopeless 0 0 0 0 0  PHQ - 2 Score 0 0 0 0 0  Altered sleeping   0    Tired, decreased energy   0    Change in appetite   0    Feeling bad or failure about yourself    0    Trouble concentrating   0    Moving slowly or fidgety/restless   0    Suicidal thoughts   0    PHQ-9 Score   0       The following portions of the patient's history were reviewed and updated as appropriate: allergies, current medications, past family history, past medical history, past social history, past surgical history, and problem list.  Review of Systems Pertinent items noted in HPI and remainder of comprehensive ROS otherwise negative.  Past medical history, past surgical history, family history and social history were all reviewed and documented in the EPIC chart.  Exam:  Vitals:   03/07/24 1138  BP: 136/84  Pulse: 86  SpO2: 99%  Weight: 165 lb (74.8 kg)  Height: 5' 4.5 (1.638 m)   Body mass index is 27.88 kg/m.  General appearance:  Normal Thyroid :  Symmetrical, normal in size, without palpable masses or nodularity. Respiratory  Auscultation:  Clear without wheezing or rhonchi Cardiovascular  Auscultation:  Regular rate, without rubs, murmurs or  gallops  Edema/varicosities:  Not grossly evident Abdominal  Soft,nontender, without masses, guarding or rebound.  Liver/spleen:  No organomegaly noted  Hernia:  None appreciated  Skin  Inspection:  Grossly normal Breasts: Examined lying and sitting.   Right: Without masses, retractions, nipple discharge or axillary adenopathy.   Left: 4cm fluctuant mass in the upper outer quadrant. No skin or nipple changes. Genitourinary   Inguinal/mons:  Normal without inguinal adenopathy  External genitalia:  Normal appearing vulva with no masses, tenderness, or lesions  BUS/Urethra/Skene's glands:  Normal  Vagina:  Normal appearing with normal color and discharge, no lesions. Atrophy: moderate   Cervix:  Normal appearing without discharge or lesions  Uterus:  Normal in size, shape and contour.  Midline and mobile, nontender  Adnexa/parametria:     Rt: Normal in size, without masses or tenderness.   Lt: Normal in size, without masses or tenderness.  Anus and perineum: Normal    Darice Hoit, CMA present for exam  Assessment/Plan:   1. Well woman exam with routine gynecological exam (Primary) Pap 2027  2. Depression screen  3. Genitourinary syndrome of menopause - Estradiol  (IMVEXXY  MAINTENANCE PACK) 10 MCG INST; Place 1 tablet vaginally 2 (two) times a week.  Dispense: 8 each; Refill: 11   Return in 1 year for annual or sooner prn.  Kalina Morabito B WHNP-BC, 11:45 AM 03/07/2024
